# Patient Record
Sex: Female | Born: 1965
Health system: Southern US, Community
[De-identification: ages and names within clinical notes are randomized; demographics above are authoritative.]

## PROBLEM LIST (undated history)

## (undated) DIAGNOSIS — K219 Gastro-esophageal reflux disease without esophagitis: Secondary | ICD-10-CM

## (undated) HISTORY — PX: WISDOM TOOTH EXTRACTION: SHX21

## (undated) HISTORY — PX: TOOTH EXTRACTION: SUR596

## (undated) HISTORY — DX: Gastro-esophageal reflux disease without esophagitis: K21.9

## (undated) HISTORY — PX: CHOLECYSTECTOMY: SHX55

---

## 1987-10-03 HISTORY — PX: DILATION AND CURETTAGE OF UTERUS: SHX78

## 2009-05-09 ENCOUNTER — Emergency Department: Payer: Self-pay | Admitting: Emergency Medicine

## 2015-01-26 ENCOUNTER — Emergency Department
Admit: 2015-01-26 | Disposition: A | Discharge status: Home / Self Care | Payer: Self-pay | Admitting: Emergency Medicine

## 2015-01-26 LAB — COMPREHENSIVE METABOLIC PANEL
ALK PHOS: 57 U/L
ALT: 16 U/L
ANION GAP: 5 — AB (ref 7–16)
Albumin: 3.8 g/dL
BILIRUBIN TOTAL: 0.6 mg/dL
BUN: 11 mg/dL
CREATININE: 0.81 mg/dL
Calcium, Total: 8.7 mg/dL — ABNORMAL LOW
Chloride: 102 mmol/L
Co2: 28 mmol/L
EGFR (African American): >60
Glucose: 132 mg/dL — ABNORMAL HIGH
POTASSIUM: 3.8 mmol/L
SGOT(AST): 19 U/L
Sodium: 135 mmol/L
Total Protein: 7.5 g/dL

## 2015-01-26 LAB — URINALYSIS, COMPLETE
BILIRUBIN, UR: NEGATIVE
Bacteria: NONE SEEN
Glucose,UR: NEGATIVE mg/dL (ref 0–75)
Ketone: NEGATIVE
Leukocyte Esterase: NEGATIVE
NITRITE: NEGATIVE
Ph: 6 (ref 4.5–8.0)
Protein: NEGATIVE
Specific Gravity: 1.008 (ref 1.003–1.030)

## 2015-01-26 LAB — CBC WITH DIFFERENTIAL/PLATELET
BASOS PCT: 0.6 %
Basophil #: 0.1 10*3/uL (ref 0.0–0.1)
Eosinophil #: 0.3 10*3/uL (ref 0.0–0.7)
Eosinophil %: 2.3 %
HCT: 45.1 % (ref 35.0–47.0)
HGB: 15.4 g/dL (ref 12.0–16.0)
LYMPHS PCT: 12.1 %
Lymphocyte #: 1.5 10*3/uL (ref 1.0–3.6)
MCH: 33 pg (ref 26.0–34.0)
MCHC: 34.1 g/dL (ref 32.0–36.0)
MCV: 97 fL (ref 80–100)
MONOS PCT: 4.8 %
Monocyte #: 0.6 x10 3/mm (ref 0.2–0.9)
Neutrophil #: 9.7 10*3/uL — ABNORMAL HIGH (ref 1.4–6.5)
Neutrophil %: 80.2 %
Platelet: 192 10*3/uL (ref 150–440)
RBC: 4.65 10*6/uL (ref 3.80–5.20)
RDW: 12.7 % (ref 11.5–14.5)
WBC: 12.1 10*3/uL — ABNORMAL HIGH (ref 3.6–11.0)

## 2015-01-26 LAB — TROPONIN I: Troponin-I: <0.03 ng/mL

## 2015-01-26 LAB — TSH: THYROID STIMULATING HORM: 2.697 u[IU]/mL

## 2015-06-06 IMAGING — CT CT ABD-PELV W/O CM
2 of 4 series · 16 of 46 positions shown, 18 images · non-contrast
Comparison: 01/26/2015 plain film exam.

CLINICAL DATA: 48-year-old female with vagal stimulation while
using restroom. Abdominal/lower pelvic pain. Initial encounter.

EXAM:
CT ABDOMEN AND PELVIS WITHOUT CONTRAST
TECHNIQUE: Multidetector CT imaging of the abdomen and pelvis was performed
following the standard protocol without IV contrast.

[Series 2: stone standard full · axial · 0.93mm/px · z∈[-784,-308]mm · 13 of 105 slices shown, 15 images]
[im 5/105  soft-tissue]
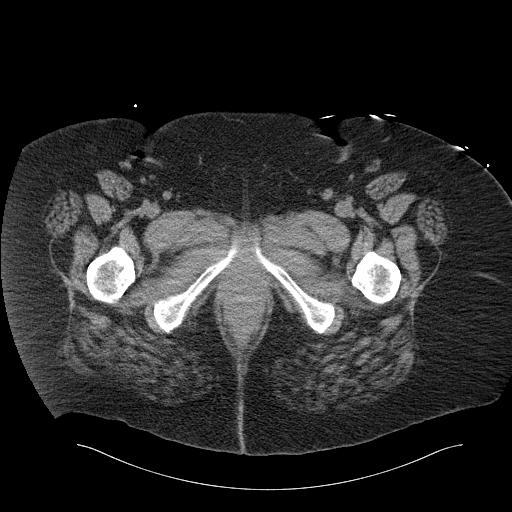
[im 5/105  bone]
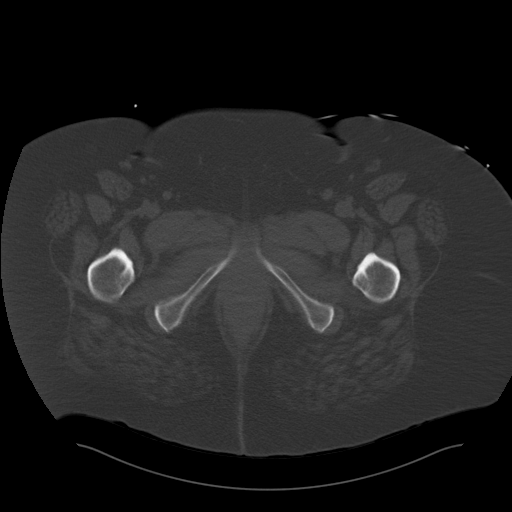
[im 15/105  soft-tissue]
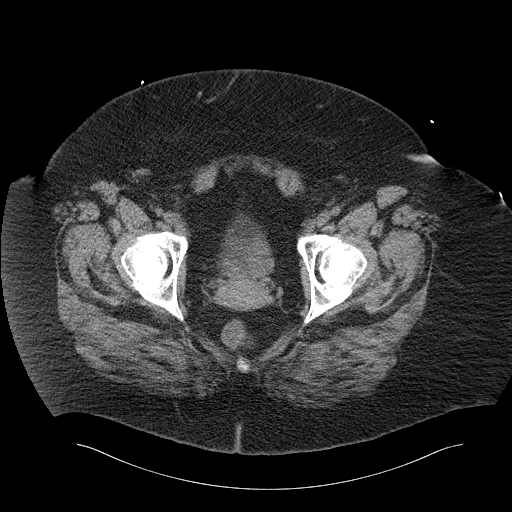
[im 24/105  soft-tissue]
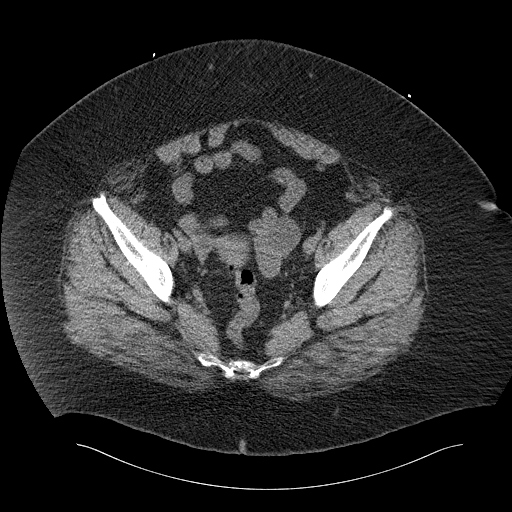
[im 29/105  soft-tissue]
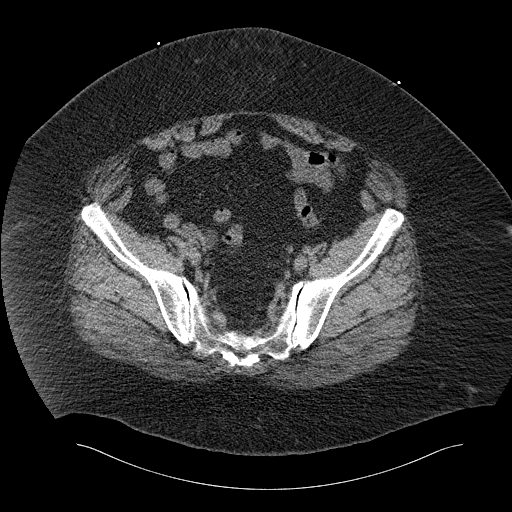
[im 38/105  soft-tissue]
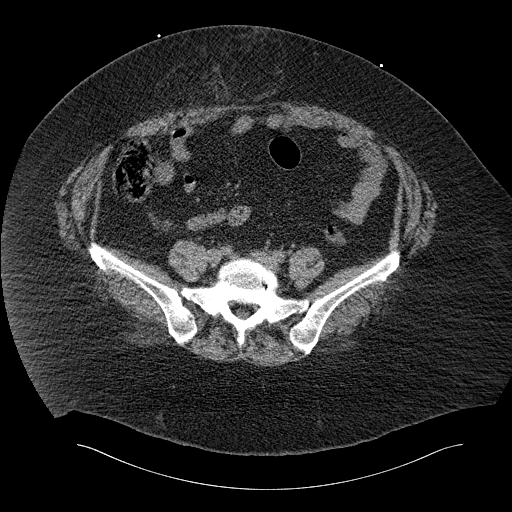
[im 43/105  soft-tissue]
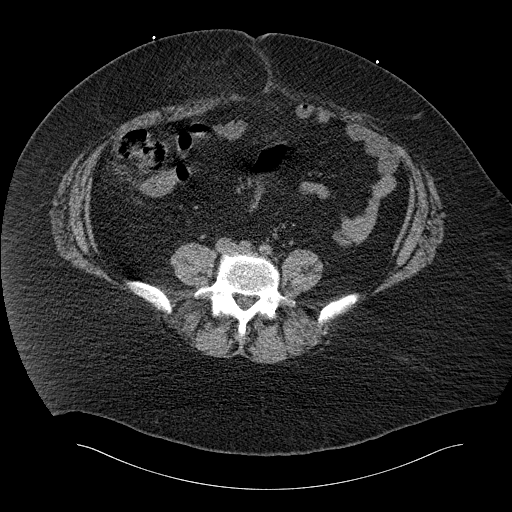
[im 53/105  soft-tissue]
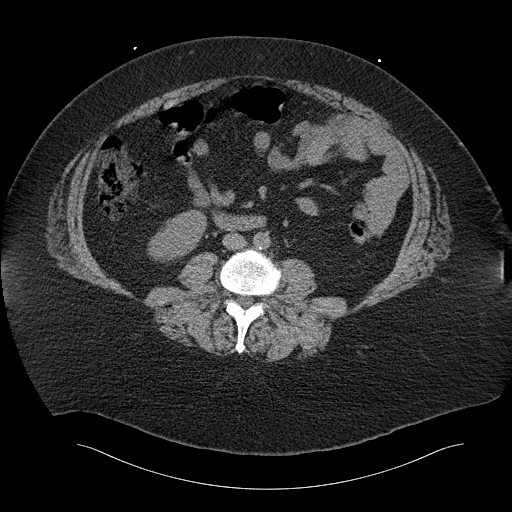
[im 62/105  soft-tissue]
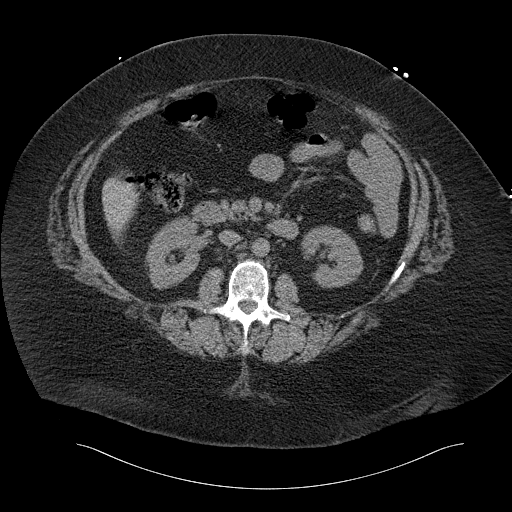
[im 67/105  soft-tissue]
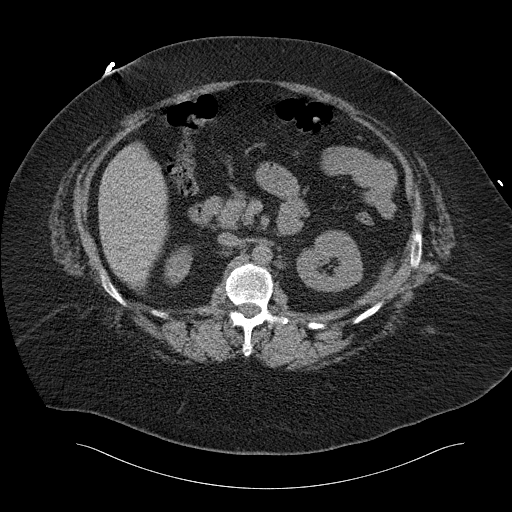
[im 67/105  bone]
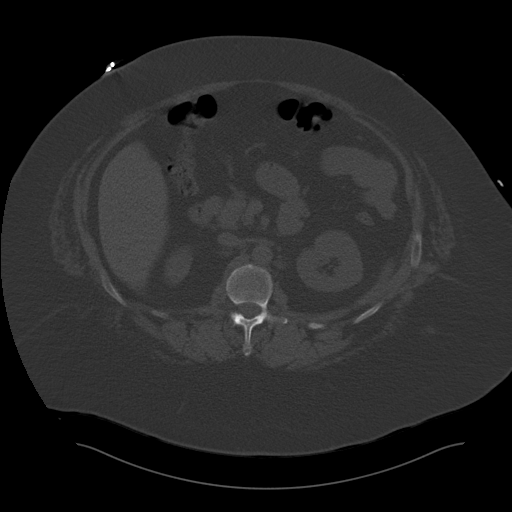
[im 76/105  soft-tissue]
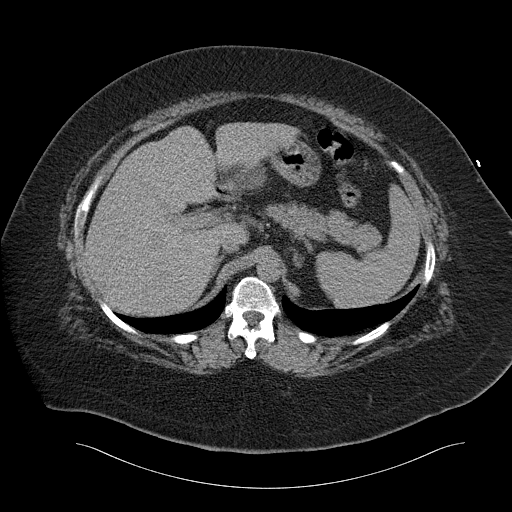
[im 81/105  soft-tissue]
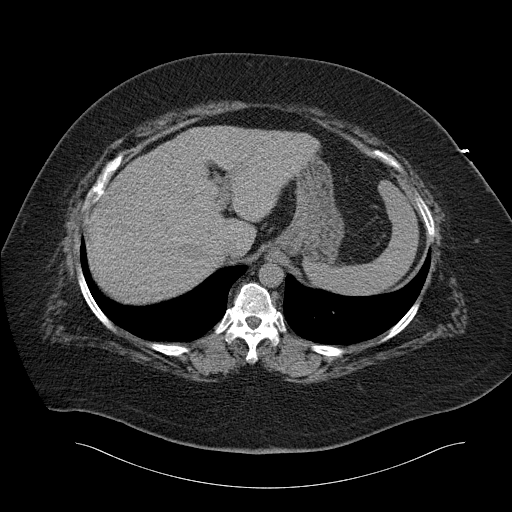
[im 90/105  soft-tissue]
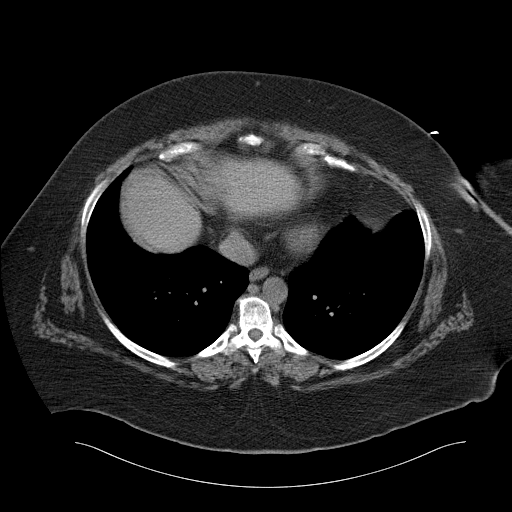
[im 100/105  soft-tissue]
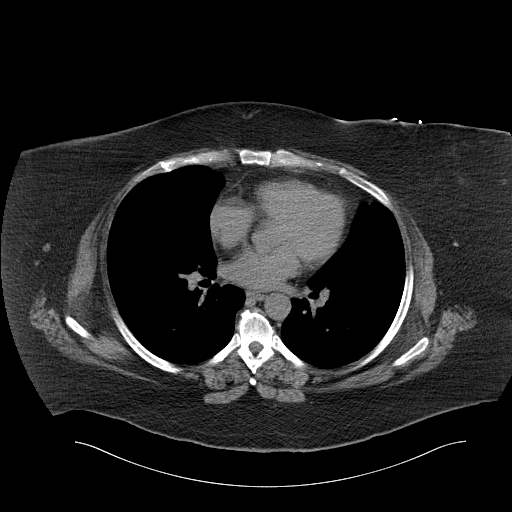

[Series 5: cor stone standard full · coronal · 0.94mm/px · 3 of 196 slices shown]
[im 66/196  soft-tissue]
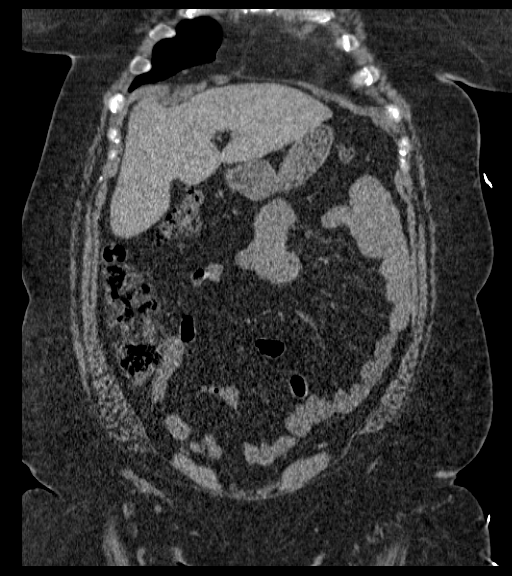
[im 87/196  soft-tissue]
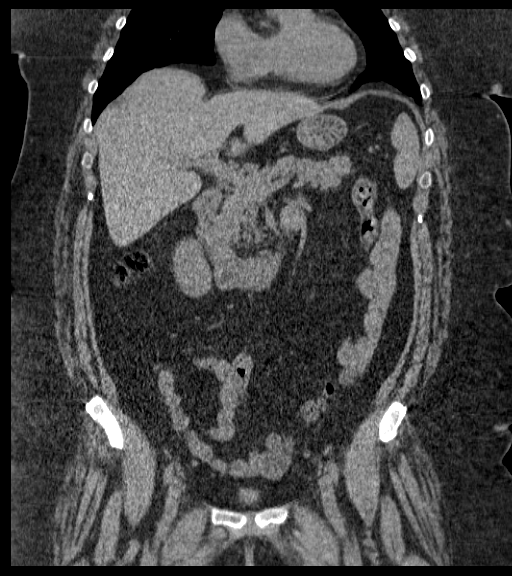
[im 109/196  soft-tissue]
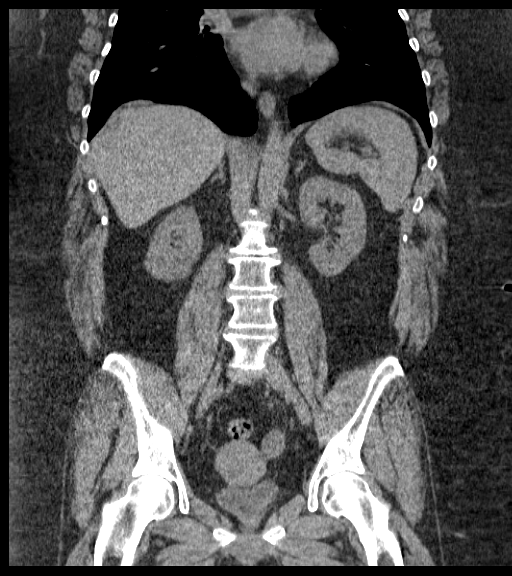

[16 of 46 positions shown; findings below may reference images not displayed]

FINDINGS: Lung bases clear.

Heart size within normal limits.

No abdominal aortic aneurysm. Trace lower abdominal aortic
calcifications.

No extra luminal bowel inflammatory process, free fluid or free air.

Large periumbilical fat and vessel containing hernia.

Mild fatty infiltration of the liver. Taking into account limitation
by non contrast imaging, no worrisome hepatic, splenic, pancreatic,
renal or adrenal lesion.

Gallbladder not visualized. Small radiopaque structure gallbladder
fossa may represent result of prior cholecystectomy.

Top-normal size external iliac/ femoral lymph nodes without
adenopathy.

Noncontrast filled underdistended views of the urinary bladder
unremarkable.

Bilateral ovarian cystic structures larger on the left spanning over
5.6 cm possibly 2 cysts side-by-side however, follow-up pelvic
sonogram may be considered.

Degenerative changes lumbar spine most notable L2-3. Sacroiliac
joint degenerative changes greater on the left.
IMPRESSION: No extra luminal bowel inflammatory process, free fluid or free air.

Large periumbilical fat and vessel containing hernia.

Mild fatty infiltration of the liver.

Gallbladder not visualized. Small radiopaque structure gallbladder
fossa may represent result of prior cholecystectomy.

Bilateral ovarian cystic structures larger on the left spanning over
5.6 cm possibly 2 cysts side-by-side however, follow-up pelvic
sonogram may be considered.

Degenerative changes lumbar spine most notable L2-3. Sacroiliac
joint degenerative changes greater on the left.

Trace calcifications lower abdominal aorta.

## 2015-11-02 ENCOUNTER — Encounter: Payer: Self-pay | Admitting: Family Medicine

## 2015-11-02 ENCOUNTER — Ambulatory Visit (INDEPENDENT_AMBULATORY_CARE_PROVIDER_SITE_OTHER): Payer: BLUE CROSS/BLUE SHIELD | Admitting: Family Medicine

## 2015-11-02 DIAGNOSIS — Z720 Tobacco use: Secondary | ICD-10-CM

## 2015-11-02 DIAGNOSIS — K219 Gastro-esophageal reflux disease without esophagitis: Secondary | ICD-10-CM

## 2015-11-02 DIAGNOSIS — R05 Cough: Secondary | ICD-10-CM

## 2015-11-02 DIAGNOSIS — R059 Cough, unspecified: Secondary | ICD-10-CM

## 2015-11-02 DIAGNOSIS — Z1239 Encounter for other screening for malignant neoplasm of breast: Secondary | ICD-10-CM | POA: Diagnosis not present

## 2015-11-02 NOTE — Progress Notes (Signed)
Pre visit review using our clinic review tool, if applicable. No additional management support is needed unless otherwise documented below in the visit note. 

## 2015-11-02 NOTE — Progress Notes (Signed)
Subjective:   Patient ID: Marissa Gutierrez, female    DOB: [birth year], 50 y.o.   MRN: [id number]  Marissa Gutierrez is a pleasant 50 y.o. year old female who presents to clinic today with [place] and Cough  on 11/02/2015  HPI:  Has never had a mammogram. Unsure when she had her last pap smear.  Has not had period since 03/2015.  Cough- ongoing for weeks.  Went to UC last week and was told it was viral. Given tessalon and cheratussiin to use prn cough.  Cough has persisted.  No longer febrile.  Denies CP or SOB. Cough is dry.  Has had some diarrhea.  Tobacco abuse- has tried to quit in past  Over 30 year pack history.  Even tried chantix without success. She is not ready to quit.  No current outpatient prescriptions on file prior to visit.   No current facility-administered medications on file prior to visit.    No Known Allergies  Past Medical History  Diagnosis Date  . GERD (gastroesophageal reflux disease)     Past Surgical History  Procedure Laterality Date  . Cholecystectomy    . Tooth extraction    . Wisdom tooth extraction    . Dilation and curettage of uterus  1989    Family History  Problem Relation Age of Onset  . Arthritis Mother   . Lung cancer Father   . Heart disease Maternal Grandfather     Social History   Social History  . Marital Status: Married    Spouse Name: N/A  . Number of Children: N/A  . Years of Education: N/A   Occupational History  . Not on file.   Social History Main Topics  . Smoking status: Current Every Day Smoker  . Smokeless tobacco: Never Used  . Alcohol Use: Yes  . Drug Use: No  . Sexual Activity: Yes    Birth Control/ Protection: Condom   Other Topics Concern  . Not on file   Social History Narrative   Works for Nutritional therapist   One son- 47 yo   The PMH, PSH, Social History, Family History, Medications, and allergies have been reviewed in [facility], and have been updated if relevant.   Review of Systems    Constitutional: Positive for fatigue. Negative for fever.  HENT: Negative.   Respiratory: Positive for cough. Negative for shortness of breath, wheezing and stridor.   Gastrointestinal: Negative.   Endocrine: Negative.   Genitourinary: Negative.   Musculoskeletal: Negative.   Skin: Negative.   Neurological: Negative.   Hematological: Negative.   All other systems reviewed and are negative.      Objective:    BP 124/76 mmHg  Pulse 71  Temp(Src) 97.8 F (36.6 C) (Oral)  Ht 5\' 6"  (1.676 m)  Wt 341 lb 12 oz (155.017 kg)  BMI 55.19 kg/m2  SpO2 95%  LMP 03/08/2015   Physical Exam  Constitutional: She is oriented to person, place, and time. She appears well-developed and well-nourished. No distress.  Morbidly obese  HENT:  Head: Normocephalic and atraumatic.  Right Ear: External ear normal.  Left Ear: External ear normal.  Nose: Nose normal.  Mouth/Throat: Oropharynx is clear and moist.  Eyes: Conjunctivae are normal.  Neck: Normal range of motion.  Cardiovascular: Normal rate and regular rhythm.   Pulmonary/Chest: Effort normal and breath sounds normal. No respiratory distress. She has no wheezes.  Musculoskeletal: Normal range of motion.  Neurological: She is alert and oriented to  person, place, and time. No cranial nerve deficit.  Skin: Skin is warm and dry.  Psychiatric: She has a normal mood and affect. Her behavior is normal. Judgment and thought content normal.  Nursing note and vitals reviewed.         Assessment & Plan:   Morbid obesity due to excess calories (HCC)  Gastroesophageal reflux disease, esophagitis presence not specified  Cough No Follow-up on file.

## 2015-11-02 NOTE — Assessment & Plan Note (Signed)
The patient was counseled on the dangers of tobacco use, and was advised to quit.  Reviewed strategies to maximize success, including removing cigarettes and smoking materials from environment. 

## 2015-11-02 NOTE — Assessment & Plan Note (Signed)
Persistent.  Agree likely viral.  Lung exam reassuring. Continue supportive care. Call or return to clinic prn if these symptoms worsen or fail to improve as anticipated. The patient indicates understanding of these issues and agrees with the plan.

## 2015-11-02 NOTE — Patient Instructions (Addendum)
Great to meet you. Please schedule an appointment for complete physical on your way out.   Please call to schedule your mammogram.

## 2015-11-22 ENCOUNTER — Encounter: Payer: BLUE CROSS/BLUE SHIELD | Admitting: Family Medicine

## 2016-01-28 DIAGNOSIS — G2581 Restless legs syndrome: Secondary | ICD-10-CM | POA: Diagnosis not present

## 2016-01-28 DIAGNOSIS — M25572 Pain in left ankle and joints of left foot: Secondary | ICD-10-CM | POA: Diagnosis not present

## 2016-01-28 DIAGNOSIS — E669 Obesity, unspecified: Secondary | ICD-10-CM | POA: Diagnosis not present

## 2016-02-04 DIAGNOSIS — E669 Obesity, unspecified: Secondary | ICD-10-CM | POA: Diagnosis not present

## 2016-02-04 DIAGNOSIS — G2581 Restless legs syndrome: Secondary | ICD-10-CM | POA: Diagnosis not present

## 2016-02-04 DIAGNOSIS — Z0001 Encounter for general adult medical examination with abnormal findings: Secondary | ICD-10-CM | POA: Diagnosis not present

## 2016-02-10 DIAGNOSIS — Z1231 Encounter for screening mammogram for malignant neoplasm of breast: Secondary | ICD-10-CM | POA: Diagnosis not present

## 2016-03-20 DIAGNOSIS — R05 Cough: Secondary | ICD-10-CM | POA: Diagnosis not present

## 2016-03-20 DIAGNOSIS — J069 Acute upper respiratory infection, unspecified: Secondary | ICD-10-CM | POA: Diagnosis not present

## 2016-03-30 DIAGNOSIS — Z0001 Encounter for general adult medical examination with abnormal findings: Secondary | ICD-10-CM | POA: Diagnosis not present

## 2016-03-30 DIAGNOSIS — Z6841 Body Mass Index (BMI) 40.0 and over, adult: Secondary | ICD-10-CM | POA: Diagnosis not present

## 2016-03-30 DIAGNOSIS — Z124 Encounter for screening for malignant neoplasm of cervix: Secondary | ICD-10-CM | POA: Diagnosis not present

## 2016-05-03 DIAGNOSIS — M5430 Sciatica, unspecified side: Secondary | ICD-10-CM | POA: Diagnosis not present

## 2016-05-03 DIAGNOSIS — M9905 Segmental and somatic dysfunction of pelvic region: Secondary | ICD-10-CM | POA: Diagnosis not present

## 2016-05-03 DIAGNOSIS — M9903 Segmental and somatic dysfunction of lumbar region: Secondary | ICD-10-CM | POA: Diagnosis not present

## 2016-05-03 DIAGNOSIS — M791 Myalgia: Secondary | ICD-10-CM | POA: Diagnosis not present

## 2016-05-05 DIAGNOSIS — M5430 Sciatica, unspecified side: Secondary | ICD-10-CM | POA: Diagnosis not present

## 2016-05-05 DIAGNOSIS — M9905 Segmental and somatic dysfunction of pelvic region: Secondary | ICD-10-CM | POA: Diagnosis not present

## 2016-05-05 DIAGNOSIS — M791 Myalgia: Secondary | ICD-10-CM | POA: Diagnosis not present

## 2016-05-05 DIAGNOSIS — M9903 Segmental and somatic dysfunction of lumbar region: Secondary | ICD-10-CM | POA: Diagnosis not present

## 2016-05-08 DIAGNOSIS — M9903 Segmental and somatic dysfunction of lumbar region: Secondary | ICD-10-CM | POA: Diagnosis not present

## 2016-05-08 DIAGNOSIS — M5430 Sciatica, unspecified side: Secondary | ICD-10-CM | POA: Diagnosis not present

## 2016-05-08 DIAGNOSIS — M791 Myalgia: Secondary | ICD-10-CM | POA: Diagnosis not present

## 2016-05-08 DIAGNOSIS — M9905 Segmental and somatic dysfunction of pelvic region: Secondary | ICD-10-CM | POA: Diagnosis not present

## 2016-05-19 DIAGNOSIS — M9903 Segmental and somatic dysfunction of lumbar region: Secondary | ICD-10-CM | POA: Diagnosis not present

## 2016-05-19 DIAGNOSIS — M791 Myalgia: Secondary | ICD-10-CM | POA: Diagnosis not present

## 2016-05-19 DIAGNOSIS — M9905 Segmental and somatic dysfunction of pelvic region: Secondary | ICD-10-CM | POA: Diagnosis not present

## 2016-05-19 DIAGNOSIS — M5430 Sciatica, unspecified side: Secondary | ICD-10-CM | POA: Diagnosis not present

## 2016-07-13 DIAGNOSIS — J0111 Acute recurrent frontal sinusitis: Secondary | ICD-10-CM | POA: Diagnosis not present

## 2017-06-06 DIAGNOSIS — E669 Obesity, unspecified: Secondary | ICD-10-CM | POA: Diagnosis not present

## 2017-06-06 DIAGNOSIS — M25511 Pain in right shoulder: Secondary | ICD-10-CM | POA: Diagnosis not present

## 2017-08-17 DIAGNOSIS — J029 Acute pharyngitis, unspecified: Secondary | ICD-10-CM | POA: Diagnosis not present

## 2017-08-17 DIAGNOSIS — F17210 Nicotine dependence, cigarettes, uncomplicated: Secondary | ICD-10-CM | POA: Diagnosis not present

## 2017-08-17 DIAGNOSIS — B373 Candidiasis of vulva and vagina: Secondary | ICD-10-CM | POA: Diagnosis not present

## 2017-08-25 DIAGNOSIS — R062 Wheezing: Secondary | ICD-10-CM | POA: Diagnosis not present

## 2017-08-25 DIAGNOSIS — J209 Acute bronchitis, unspecified: Secondary | ICD-10-CM | POA: Diagnosis not present

## 2017-08-28 DIAGNOSIS — R05 Cough: Secondary | ICD-10-CM | POA: Diagnosis not present

## 2017-08-28 DIAGNOSIS — J069 Acute upper respiratory infection, unspecified: Secondary | ICD-10-CM | POA: Diagnosis not present

## 2017-10-18 ENCOUNTER — Encounter: Payer: Self-pay | Admitting: Internal Medicine

## 2017-10-18 ENCOUNTER — Ambulatory Visit: Payer: BLUE CROSS/BLUE SHIELD | Admitting: Internal Medicine

## 2017-10-18 VITALS — BP 138/63 | HR 68 | Resp 16 | Ht 67.0 in | Wt 319.4 lb

## 2017-10-18 DIAGNOSIS — R079 Chest pain, unspecified: Secondary | ICD-10-CM | POA: Diagnosis not present

## 2017-10-18 DIAGNOSIS — K219 Gastro-esophageal reflux disease without esophagitis: Secondary | ICD-10-CM | POA: Diagnosis not present

## 2017-10-18 DIAGNOSIS — F17219 Nicotine dependence, cigarettes, with unspecified nicotine-induced disorders: Secondary | ICD-10-CM | POA: Diagnosis not present

## 2017-10-18 DIAGNOSIS — R072 Precordial pain: Secondary | ICD-10-CM

## 2017-10-18 NOTE — Progress Notes (Signed)
[facility] [place],  [id number]  Pulmonary Sleep Medicine  Office Visit Note  Patient Name: Marissa Gutierrez DOB: 1965-10-03 MRN [id number]  Date of Service: 10/18/2017  Complaints/HPI:  Patient presents as a walk-in for chest pain.  She states she has had pain in her central chest tube feels a very deep down pain.  She says sometimes as a heaviness.  She states last night she has noted pain now is going in her arm also.  Did not appear to be brought on by anything.  She states she has been moving things around her home and she thought that maybe she actually strained herself in pulled muscle.  She states she has some shortness of breath associated.  No nausea no vomiting.  She has had no syncope.  She denies having any palpitations.  She states she does smoke.  In she is overweight.  Denies having any diabetes.  She is postmenopausal.  She has never had a cardiac evaluation done in the past.  States that her family history is strong 4 heart problems her father had an irregular heartbeat  ROS  General: (-) fever, (-) chills, (-) night sweats, (-) weakness Skin: (-) rashes, (-) itching,. Eyes: (-) visual changes, (-) redness, (-) itching. Nose and Sinuses: (-) nasal stuffiness or itchiness, (-) postnasal drip, (-) nosebleeds, (-) sinus trouble. Mouth and Throat: (-) sore throat, (-) hoarseness. Neck: (-) swollen glands, (-) enlarged thyroid, (-) neck pain. Respiratory:  no cough, (-) bloody sputum,  no shortness of breath,  no wheezing. Cardiovascular: + ankle swelling, + chest pain. Lymphatic: (-) lymph node enlargement. Neurologic: (-) numbness, (-) tingling. Psychiatric: (-) anxiety, (-) depression   Current Medication: Outpatient Encounter Medications as of 10/18/2017  Medication Sig  . benzonatate (TESSALON) 200 MG capsule Take 200 mg by mouth 3 (three) times daily as needed for cough.  Marland Kitchen guaiFENesin-codeine (ROBITUSSIN AC) 100-10 MG/5ML syrup Take 5  mLs by mouth 3 (three) times daily as needed for cough.   No facility-administered encounter medications on file as of 10/18/2017.     Surgical History: Past Surgical History:  Procedure Laterality Date  . CHOLECYSTECTOMY    . DILATION AND CURETTAGE OF UTERUS  1989  . TOOTH EXTRACTION    . WISDOM TOOTH EXTRACTION      Medical History: Past Medical History:  Diagnosis Date  . GERD (gastroesophageal reflux disease)     Family History: Family History  Problem Relation Age of Onset  . Arthritis Mother   . Lung cancer Father   . Heart disease Maternal Grandfather     Social History: Social History   Socioeconomic History  . Marital status: Married    Spouse name: Not on file  . Number of children: Not on file  . Years of education: Not on file  . Highest education level: Not on file  Social Needs  . Financial resource strain: Not on file  . Food insecurity - worry: Not on file  . Food insecurity - inability: Not on file  . Transportation needs - medical: Not on file  . Transportation needs - non-medical: Not on file  Occupational History  . Not on file  Tobacco Use  . Smoking status: Current Every Day Smoker  . Smokeless tobacco: Never Used  Substance and Sexual Activity  . Alcohol use: Yes  . Drug use: No  . Sexual activity: Yes    Birth control/protection: Condom  Other Topics Concern  . 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 rate 16, height 5\' 7"  (1.702 m), weight (!) 319 lb 6.4 oz (144.9 kg), SpO2 95 %.  Examination: General Appearance: The patient is well-developed, well-nourished, and in no distress. Skin: Gross inspection of skin unremarkable. Head: normocephalic, no gross deformities. Eyes: no gross deformities noted. ENT: ears appear grossly normal no exudates. malampatti score 3 Neck: Supple. No thyromegaly. No LAD. Respiratory:  Good air  entry noted bilaterally no rhonchi or rales. Cardiovascular: Normal S1 and S2 without murmur or rub. Extremities: No cyanosis. pulses are equal. Neurologic: Alert and oriented. No involuntary movements.  LABS: No results found for this or any previous visit (from the past 2160 hour(s)).  Radiology: Ct Abdomen Pelvis Wo Contrast  Result Date: 01/26/2015 CLINICAL DATA:  52 year old female with vagal stimulation while using restroom. Abdominal/lower pelvic pain. Initial encounter. EXAM: CT ABDOMEN AND PELVIS WITHOUT CONTRAST TECHNIQUE: Multidetector CT imaging of the abdomen and pelvis was performed following the standard protocol without IV contrast. COMPARISON:  01/26/2015 plain film exam. FINDINGS: Lung bases clear. Heart size within normal limits. No abdominal aortic aneurysm. Trace lower abdominal aortic calcifications. No extra luminal bowel inflammatory process, free fluid or free air. Large periumbilical fat and vessel containing hernia. Mild fatty infiltration of the liver. Taking into account limitation by non contrast imaging, no worrisome hepatic, splenic, pancreatic, renal or adrenal lesion. Gallbladder not visualized. Small radiopaque structure gallbladder fossa may represent result of prior cholecystectomy. Top-normal size external iliac/ femoral lymph nodes without adenopathy. Noncontrast filled underdistended views of the urinary bladder unremarkable. Bilateral ovarian cystic structures larger on the left spanning over 5.6 cm possibly 2 cysts side-by-side however, follow-up pelvic sonogram may be considered. Degenerative changes lumbar spine most notable L2-3. Sacroiliac joint degenerative changes greater on the left. IMPRESSION: No extra luminal bowel inflammatory process, free fluid or free air. Large periumbilical fat and vessel containing hernia. Mild fatty infiltration of the liver. Gallbladder not visualized. Small radiopaque structure gallbladder fossa may represent result of prior  cholecystectomy. Bilateral ovarian cystic structures larger on the left spanning over 5.6 cm possibly 2 cysts side-by-side however, follow-up pelvic sonogram may be considered. Degenerative changes lumbar spine most notable L2-3. Sacroiliac joint degenerative changes greater on the left. Trace calcifications lower abdominal aorta. Electronically Signed   By: Genia Del M.D.   On: 01/26/2015 07:53   Dg Abd 1 View  Result Date: 01/26/2015 CLINICAL DATA:  Lower abdominal pain and pressure. EXAM: ABDOMEN - 1 VIEW COMPARISON:  None. FINDINGS: The bowel gas pattern is normal. Stool volume is within normal limits. No radio-opaque calculi or other significant radiographic abnormality are seen. IMPRESSION: Negative. Electronically Signed   By: Monte Fantasia M.D.   On: 01/26/2015 07:10   US Transvaginal Non-ob  Result Date: 01/26/2015 CLINICAL DATA:  Large cysts, pelvic pain beginning last night EXAM: TRANSABDOMINAL AND TRANSVAGINAL ULTRASOUND OF PELVIS DOPPLER ULTRASOUND OF OVARIES TECHNIQUE: Both transabdominal and transvaginal ultrasound examinations of the pelvis were performed. Transabdominal technique was performed for global imaging of the pelvis including uterus, ovaries, adnexal regions, and pelvic cul-de-sac. It was necessary to proceed with endovaginal exam following the transabdominal exam to visualize the ovaries. Color and duplex Doppler ultrasound was utilized to evaluate blood flow to the ovaries. COMPARISON:  CT 01/26/2015 FINDINGS: Uterus Measurements: 7.9 x 3.8 x 5.9 cm. No fibroids or other mass visualized.  Endometrium Thickness: 10.1 mm in thickness.  No focal abnormality visualized. Right ovary Measurements: 3.5 x 2.0 x 2.8 cm. Small simple appearing cystic area in the right ovary measures up to 3.5 cm. Left ovary Measurements: 2.4 x 2.2 x 4.0 cm. Elongated cystic area in the right adnexa measures 5.8 x 3.6 x 2.6 cm. The minimally complex cyst measures up to 2.4 cm, likely small hemorrhagic  cyst or follicle. Pulsed Doppler evaluation of both ovaries demonstrates normal low-resistance arterial and venous waveforms. Other findings With IMPRESSION: Small right ovarian cyst which appears simple. Minimally complex left ovarian follicle, likely hemorrhagic follicle. Elongated paraovarian cystic structure could reflect paraovarian cyst or hydrosalpinx. No evidence of torsion. Electronically Signed   By: Rolm Baptise M.D.   On: 01/26/2015 09:15   [place] Art/ven Flow Abd Pelv Doppler Limited  Result Date: 01/26/2015 CLINICAL DATA:  Large cysts, pelvic pain beginning last night EXAM: TRANSABDOMINAL AND TRANSVAGINAL ULTRASOUND OF PELVIS DOPPLER ULTRASOUND OF OVARIES TECHNIQUE: Both transabdominal and transvaginal ultrasound examinations of the pelvis were performed. Transabdominal technique was performed for global imaging of the pelvis including uterus, ovaries, adnexal regions, and pelvic cul-de-sac. It was necessary to proceed with endovaginal exam following the transabdominal exam to visualize the ovaries. Color and duplex Doppler ultrasound was utilized to evaluate blood flow to the ovaries. COMPARISON:  CT 01/26/2015 FINDINGS: Uterus Measurements: 7.9 x 3.8 x 5.9 cm. No fibroids or other mass visualized. Endometrium Thickness: 10.1 mm in thickness.  No focal abnormality visualized. Right ovary Measurements: 3.5 x 2.0 x 2.8 cm. Small simple appearing cystic area in the right ovary measures up to 3.5 cm. Left ovary Measurements: 2.4 x 2.2 x 4.0 cm. Elongated cystic area in the right adnexa measures 5.8 x 3.6 x 2.6 cm. The minimally complex cyst measures up to 2.4 cm, likely small hemorrhagic cyst or follicle. Pulsed Doppler evaluation of both ovaries demonstrates normal low-resistance arterial and venous waveforms. Other findings With IMPRESSION: Small right ovarian cyst which appears simple. Minimally complex left ovarian follicle, likely hemorrhagic follicle. Elongated paraovarian cystic structure could  reflect paraovarian cyst or hydrosalpinx. No evidence of torsion. Electronically Signed   By: Rolm Baptise M.D.   On: 01/26/2015 09:15   Dg Chest Port 1 View  Result Date: 01/26/2015 CLINICAL DATA:  Near syncope EXAM: PORTABLE CHEST - 1 VIEW COMPARISON:  05/09/2009 FINDINGS: Normal heart size and mediastinal contours when accounting for a probable lower mediastinal fat pad. No acute infiltrate or edema. No effusion or pneumothorax. No acute osseous findings. IMPRESSION: No active disease. Electronically Signed   By: Monte Fantasia M.D.   On: 01/26/2015 07:10    No results found.  No results found.    Assessment and Plan: Patient Active Problem List   Diagnosis Date Noted  . Morbid obesity ([place]) 11/02/2015  . GERD (gastroesophageal reflux disease) 11/02/2015  . Cough 11/02/2015  . Tobacco abuse 11/02/2015    1. Chest Pain  unclear etiology the seems a little bit suspicious for acute coronary syndrome At this time she is pain free she appears to be comfortable.   We will schedule her for a stress test and also get an echocardiogram done.   EKG was done in the room and this did not reveal any acute changes though of poor quality due to her body habitus  2. Obesity  we briefly discussed obesity and coronary risk She needs to work on weight loss  3. GERD  she states that she does have history of  pretty bad reflux in the past  She had been on Prilosec S suggested that she start taking over-the-counter medications again  4. Smoker  we discussed smoking cessation at this time she is going to try to quit smoking She is going to explore E cigarette as well as nicotine replacement therapy   General Counseling: I have discussed the findings of the evaluation and examination with [place].  I have also discussed any further diagnostic evaluation thatmay be needed or ordered today. Caleigha verbalizes understanding of the findings of todays visit. We also reviewed her medications today and  discussed drug interactions and side effects including but not limited excessive drowsiness and altered mental states. We also discussed that there is always a risk not just to her but also people around her. she has been encouraged to call the office with any questions or concerns that should arise related to todays visit.    Time spent: 69min  I have personally obtained a history, examined the patient, evaluated laboratory and imaging results, formulated the assessment and plan and placed orders.    Allyne Gee, MD [facility] Pulmonary and Critical Care Sleep medicine

## 2017-10-18 NOTE — Patient Instructions (Signed)
Chest Wall Pain °Chest wall pain is pain in or around the bones and muscles of your chest. Sometimes, an injury causes this pain. Sometimes, the cause may not be known. This pain may take several weeks or longer to get better. °Follow these instructions at home: °Pay attention to any changes in your symptoms. Take these actions to help with your pain: °· Rest as told by your doctor. °· Avoid activities that cause pain. Try not to use your chest, belly (abdominal), or side muscles to lift heavy things. °· If directed, apply ice to the painful area: °? Put ice in a plastic bag. °? Place a towel between your skin and the bag. °? Leave the ice on for 20 minutes, 2-3 times per day. °· Take over-the-counter and prescription medicines only as told by your doctor. °· Do not use tobacco products, including cigarettes, chewing tobacco, and e-cigarettes. If you need help quitting, ask your doctor. °· Keep all follow-up visits as told by your doctor. This is important. ° °Contact a doctor if: °· You have a fever. °· Your chest pain gets worse. °· You have new symptoms. °Get help right away if: °· You feel sick to your stomach (nauseous) or you throw up (vomit). °· You feel sweaty or light-headed. °· You have a cough with phlegm (sputum) or you cough up blood. °· You are short of breath. °This information is not intended to replace advice given to you by your health care provider. Make sure you discuss any questions you have with your health care provider. °Document Released: 03/06/2008 Document Revised: 02/24/2016 Document Reviewed: 12/14/2014 °Elsevier Interactive Patient Education © 2018 [employer] ° °

## 2017-10-19 DIAGNOSIS — M25511 Pain in right shoulder: Secondary | ICD-10-CM | POA: Diagnosis not present

## 2017-10-19 DIAGNOSIS — R079 Chest pain, unspecified: Secondary | ICD-10-CM | POA: Diagnosis not present

## 2018-01-01 ENCOUNTER — Encounter: Payer: Self-pay | Admitting: Internal Medicine

## 2018-01-01 ENCOUNTER — Ambulatory Visit: Payer: BLUE CROSS/BLUE SHIELD | Admitting: Internal Medicine

## 2018-01-01 VITALS — BP 110/68 | HR 62 | Temp 98.0°F | Resp 16 | Ht 67.0 in | Wt 314.0 lb

## 2018-01-01 DIAGNOSIS — J0100 Acute maxillary sinusitis, unspecified: Secondary | ICD-10-CM | POA: Diagnosis not present

## 2018-01-01 DIAGNOSIS — J3089 Other allergic rhinitis: Secondary | ICD-10-CM | POA: Diagnosis not present

## 2018-01-01 MED ORDER — AMOXICILLIN-POT CLAVULANATE 875-125 MG PO TABS: 1.0000 | ORAL_TABLET | Freq: Two times a day (BID) | ORAL | 0 refills | Status: DC

## 2018-01-01 NOTE — Progress Notes (Signed)
[facility] [place], [place] [address]  Internal MEDICINE  Office Visit Note  Patient Name: Marissa Gutierrez  [id number]  [id number]  Date of Service: 01/25/2018  Chief Complaint  Patient presents with  . Sinusitis    started saturday with sore throat  . Sore Throat  . Cough    Sinusitis  This is a new problem. The current episode started in the past 7 days. There has been no fever. Associated symptoms include congestion, coughing and headaches. Pertinent negatives include no chills.  Sore Throat   Associated symptoms include congestion, coughing and headaches.  Cough  Associated symptoms include a fever, headaches and postnasal drip. Pertinent negatives include no chills.   Current Medication: Outpatient Encounter Medications as of 01/01/2018  Medication Sig  . levocetirizine (XYZAL) 5 MG tablet Take 5 mg by mouth every evening.  Marland Kitchen amoxicillin-clavulanate (AUGMENTIN) 875-125 MG tablet Take 1 tablet by mouth 2 (two) times daily.  . [DISCONTINUED] benzonatate (TESSALON) 200 MG capsule Take 200 mg by mouth 3 (three) times daily as needed for cough.  . [DISCONTINUED] guaiFENesin-codeine (ROBITUSSIN AC) 100-10 MG/5ML syrup Take 5 mLs by mouth 3 (three) times daily as needed for cough.   No facility-administered encounter medications on file as of 01/01/2018.     Surgical History: Past Surgical History:  Procedure Laterality Date  . CHOLECYSTECTOMY    . DILATION AND CURETTAGE OF UTERUS  1989  . TOOTH EXTRACTION    . WISDOM TOOTH EXTRACTION      Medical History: Past Medical History:  Diagnosis Date  . GERD (gastroesophageal reflux disease)     Family History: Family History  Problem Relation Age of Onset  . Arthritis Mother   . Lung cancer Father   . Heart disease Maternal Grandfather     Social History   Socioeconomic History  . Marital status: Married    Spouse name: Not on file  . Number of children: Not on file  . Years of education:  Not on file  . Highest education level: Not on file  Occupational History  . Not on file  Social Needs  . Financial resource strain: Not on file  . Food insecurity:    Worry: Not on file    Inability: Not on file  . Transportation needs:    Medical: Not on file    Non-medical: Not on file  Tobacco Use  . Smoking status: Current Every Day Smoker  . Smokeless tobacco: Never Used  Substance and Sexual Activity  . Alcohol use: Yes  . Drug use: No  . Sexual activity: Yes    Birth control/protection: Condom  Lifestyle  . Physical activity:    Days per week: Not on file    Minutes per session: Not on file  . Stress: Not on file  Relationships  . Social connections:    Talks on phone: Not on file    Gets together: Not on file    Attends religious service: Not on file    Active member of club or organization: Not on file    Attends meetings of clubs or organizations: Not on file    Relationship status: Not on file  . Intimate partner violence:    Fear of current or ex partner: Not on file    Emotionally abused: Not on file    Physically abused: Not on file    Forced sexual activity: Not on file  Other Topics Concern  . Not on file  Social  History Narrative   Works for Nutritional therapist   One son- 7 yo   Review of Systems  Constitutional: Positive for fever. Negative for chills.  HENT: Positive for congestion and postnasal drip.   Eyes: Negative.   Respiratory: Positive for cough.   Cardiovascular: Negative.   Neurological: Positive for headaches.   Vital Signs: BP 110/68 (BP Location: Right Arm, Patient Position: Sitting, Cuff Size: Normal)   Pulse 62   Temp 98 F (36.7 C) (Oral)   Resp 16   Ht 5\' 7"  (1.702 m)   Wt (!) 314 lb (142.4 kg)   SpO2 97%   BMI 49.18 kg/m   Physical Exam  Constitutional: She appears well-developed and well-nourished.  HENT:  Head: Normocephalic and atraumatic.  Nose: Mucosal edema and sinus tenderness present. Right sinus  exhibits maxillary sinus tenderness. Left sinus exhibits maxillary sinus tenderness.  Mouth/Throat: Oropharynx is clear and moist.  Eyes: Pupils are equal, round, and reactive to light.  Neck: Normal range of motion. Neck supple.   Assessment/Plan: 1. Non-seasonal allergic rhinitis due to other allergic trigger 2. Acute non-recurrent maxillary sinusitis  Other orders - May try otc Flonase  - levocetirizine (XYZAL) 5 MG tablet; Take 5 mg by mouth every evening. - amoxicillin-clavulanate (AUGMENTIN) 875-125 MG tablet; Take 1 tablet by mouth 2 (two) times daily.  General Counseling: [name] verbalizes understanding of the findings of todays visit and agrees with plan of treatment. I have discussed any further diagnostic evaluation that may be needed or ordered today. We also reviewed her medications today. she has been encouraged to call the office with any questions or concerns that should arise related to todays visit.   Meds ordered this encounter  Medications  . amoxicillin-clavulanate (AUGMENTIN) 875-125 MG tablet    Sig: Take 1 tablet by mouth 2 (two) times daily.    Dispense:  20 tablet    Refill:  0    Time spent:15 Minutes    Dr Lavera Guise Internal medicine

## 2018-04-03 ENCOUNTER — Telehealth: Payer: Self-pay

## 2018-04-03 NOTE — Telephone Encounter (Signed)
Unable to leave message to advise her that we will waive her $20.00 balance. Titania  *voicemail full*

## 2018-05-09 ENCOUNTER — Encounter: Payer: Self-pay | Admitting: Adult Health

## 2018-05-09 ENCOUNTER — Ambulatory Visit: Payer: Self-pay | Admitting: Adult Health

## 2018-05-09 VITALS — BP 138/82 | HR 69 | Temp 98.0°F | Ht 67.0 in | Wt 316.0 lb

## 2018-05-09 DIAGNOSIS — F32A Depression, unspecified: Secondary | ICD-10-CM

## 2018-05-09 DIAGNOSIS — K219 Gastro-esophageal reflux disease without esophagitis: Secondary | ICD-10-CM | POA: Diagnosis not present

## 2018-05-09 DIAGNOSIS — F329 Major depressive disorder, single episode, unspecified: Secondary | ICD-10-CM

## 2018-05-09 DIAGNOSIS — F17219 Nicotine dependence, cigarettes, with unspecified nicotine-induced disorders: Secondary | ICD-10-CM | POA: Diagnosis not present

## 2018-05-09 MED ORDER — SERTRALINE HCL 50 MG PO TABS: 50.0000 mg | ORAL_TABLET | Freq: Every day | ORAL | 3 refills | Status: DC

## 2018-05-09 NOTE — Progress Notes (Signed)
[facility] [place],  [address]  Internal MEDICINE  Office Visit Note  Patient Name: Marissa Gutierrez  [id number]  [id number]  Date of Service: 05/09/2018  Chief Complaint  Patient presents with  . Depression    4 months, difficulty with dealing and talking to people ,having panic attacks  . Quality Metric Gaps    mammogram, colonoscopy      HPI Pt is here for a sick visit. Pt states she has been depressed and anxious for the last 3-4 months.  In this time her Brother and has been diagnosed and died of cancer.  She was his primary care giver.  Also her mother is suffering from Parkinson's disease.  She also her primary caregiver as well.  Her work has been stressful, and she is having to miss a lot of work to help her mother with doctors appointments etc.  Today, she is tearful in the exam room, and saying "I just can't get control of the crying and feeling this way."  Pt denies SI or HI.    Current Medication:  Outpatient Encounter Medications as of 05/09/2018  Medication Sig  . [DISCONTINUED] amoxicillin-clavulanate (AUGMENTIN) 875-125 MG tablet Take 1 tablet by mouth 2 (two) times daily. (Patient not taking: Reported on 05/09/2018)  . [DISCONTINUED] levocetirizine (XYZAL) 5 MG tablet Take 5 mg by mouth every evening.   No facility-administered encounter medications on file as of 05/09/2018.       Medical History: Past Medical History:  Diagnosis Date  . GERD (gastroesophageal reflux disease)      Vital Signs: BP 138/82   Pulse 69   Temp 98 F (36.7 C)   Ht 5\' 7"  (1.702 m)   Wt (!) 316 lb (143.3 kg)   SpO2 98%   BMI 49.49 kg/m    Review of Systems  Constitutional: Negative for chills, fatigue and unexpected weight change.  HENT: Negative for congestion, rhinorrhea, sneezing and sore throat.   Eyes: Negative for photophobia, pain and redness.  Respiratory: Negative for cough, chest tightness and shortness of breath.   Cardiovascular:  Negative for chest pain and palpitations.  Gastrointestinal: Negative for abdominal pain, constipation, diarrhea, nausea and vomiting.  Endocrine: Negative.   Genitourinary: Negative for dysuria and frequency.  Musculoskeletal: Negative for arthralgias, back pain, joint swelling and neck pain.  Skin: Negative for rash.  Allergic/Immunologic: Negative.   Neurological: Negative for tremors and numbness.  Hematological: Negative for adenopathy. Does not bruise/bleed easily.  Psychiatric/Behavioral: Negative for behavioral problems and sleep disturbance. The patient is not nervous/anxious.     Physical Exam  Constitutional: She is oriented to person, place, and time. She appears well-developed and well-nourished. No distress.  HENT:  Head: Normocephalic and atraumatic.  Mouth/Throat: Oropharynx is clear and moist. No oropharyngeal exudate.  Eyes: Pupils are equal, round, and reactive to light. EOM are normal.  Neck: Normal range of motion. Neck supple. No JVD present. No tracheal deviation present. No thyromegaly present.  Cardiovascular: Normal rate, regular rhythm and normal heart sounds. Exam reveals no gallop and no friction rub.  No murmur heard. Pulmonary/Chest: Effort normal and breath sounds normal. No respiratory distress. She has no wheezes. She has no rales. She exhibits no tenderness.  Abdominal: Soft. There is no tenderness. There is no guarding.  Musculoskeletal: Normal range of motion.  Lymphadenopathy:    She has no cervical adenopathy.  Neurological: She is alert and oriented to person, place, and time. No cranial nerve deficit.  Skin: Skin is warm and dry. She is not diaphoretic.  Psychiatric: She has a normal mood and affect. Her behavior is normal. Judgment and thought content normal.  Nursing note and vitals reviewed.   Assessment/Plan: 1. Depression, unspecified depression type Pt to start Zoloft.  Will follow up in 6 weeks.  Encouraged pt to seek counseling,  offered referral.    - sertraline (ZOLOFT) 50 MG tablet; Take 1 tablet (50 mg total) by mouth daily.  Dispense: 30 tablet; Refill: 3  2. Cigarette nicotine dependence with nicotine-induced disorder Smoking cessation counseling: 1. Pt acknowledges the risks of long term smoking, she will try to quite smoking. 2. Options for different medications including nicotine products, chewing gum, patch etc, Wellbutrin and Chantix is discussed 3. Goal and date of compete cessation is discussed 4. Total time spent in smoking cessation is 10 min.   3. Gastroesophageal reflux disease, esophagitis presence not specified Stable.  Pt uses OTC Rolaids as needed.   General Counseling: Marissa Gutierrez verbalizes understanding of the findings of todays visit and agrees with plan of treatment. I have discussed any further diagnostic evaluation that may be needed or ordered today. We also reviewed her medications today. she has been encouraged to call the office with any questions or concerns that should arise related to todays visit.   No orders of the defined types were placed in this encounter.   No orders of the defined types were placed in this encounter.   Time spent: 25 Minutes  This patient was seen by Orson Gear AGNP-C in Collaboration with Dr Lavera Guise as a part of collaborative care agreement

## 2018-05-09 NOTE — Patient Instructions (Signed)

## 2018-06-24 ENCOUNTER — Encounter: Payer: Self-pay | Admitting: Adult Health

## 2018-12-18 ENCOUNTER — Encounter: Payer: Self-pay | Admitting: Adult Health

## 2018-12-18 ENCOUNTER — Ambulatory Visit: Payer: BC Managed Care – PPO | Admitting: Adult Health

## 2018-12-18 VITALS — BP 122/72 | HR 56 | Temp 97.7°F | Resp 16 | Ht 67.0 in | Wt 308.0 lb

## 2018-12-18 DIAGNOSIS — J0110 Acute frontal sinusitis, unspecified: Secondary | ICD-10-CM | POA: Diagnosis not present

## 2018-12-18 DIAGNOSIS — R001 Bradycardia, unspecified: Secondary | ICD-10-CM

## 2018-12-18 DIAGNOSIS — F17219 Nicotine dependence, cigarettes, with unspecified nicotine-induced disorders: Secondary | ICD-10-CM

## 2018-12-18 DIAGNOSIS — K219 Gastro-esophageal reflux disease without esophagitis: Secondary | ICD-10-CM

## 2018-12-18 MED ORDER — AMOXICILLIN-POT CLAVULANATE 875-125 MG PO TABS: 1.0000 | ORAL_TABLET | Freq: Two times a day (BID) | ORAL | 0 refills | Status: DC

## 2018-12-18 NOTE — Progress Notes (Signed)
[facility] [place], [place] [address]  Internal MEDICINE  Office Visit Note  Patient Name: Marissa Gutierrez  [id number]  [id number]  Date of Service: 12/18/2018  Chief Complaint  Patient presents with  . Sinusitis    started yesterday congestion and headache   . Cough     HPI Pt is here for a sick visit. Pt reports PND and headache that has been going on for a couple days.  She feels sinus pain and pressure that is extreme.  She denies fever, chills, or dizziness.    Current Medication:  Outpatient Encounter Medications as of 12/18/2018  Medication Sig  . amoxicillin-clavulanate (AUGMENTIN) 875-125 MG tablet Take 1 tablet by mouth 2 (two) times daily.  . [DISCONTINUED] sertraline (ZOLOFT) 50 MG tablet Take 1 tablet (50 mg total) by mouth daily. (Patient not taking: Reported on 12/18/2018)   No facility-administered encounter medications on file as of 12/18/2018.       Medical History: Past Medical History:  Diagnosis Date  . GERD (gastroesophageal reflux disease)      Vital Signs: BP 122/72   Pulse (!) 56   Temp 97.7 F (36.5 C)   Resp 16   Ht 5\' 7"  (1.702 m)   Wt (!) 308 lb (139.7 kg)   SpO2 97%   BMI 48.24 kg/m    Review of Systems  Constitutional: Negative for chills, fatigue and unexpected weight change.  HENT: Positive for postnasal drip, sinus pressure and sinus pain. Negative for congestion, rhinorrhea and sneezing.   Eyes: Negative for photophobia, pain and redness.  Respiratory: Negative for cough, chest tightness and shortness of breath.   Cardiovascular: Negative for chest pain and palpitations.  Gastrointestinal: Negative for abdominal pain, constipation, diarrhea, nausea and vomiting.  Endocrine: Negative.   Genitourinary: Negative for dysuria and frequency.  Musculoskeletal: Negative for arthralgias, back pain, joint swelling and neck pain.  Skin: Negative for rash.  Allergic/Immunologic: Negative.   Neurological:  Negative for tremors and numbness.  Hematological: Negative for adenopathy. Does not bruise/bleed easily.  Psychiatric/Behavioral: Negative for behavioral problems and sleep disturbance. The patient is not nervous/anxious.     Physical Exam Vitals signs and nursing note reviewed.  Constitutional:      General: She is not in acute distress.    Appearance: She is well-developed. She is not diaphoretic.  HENT:     Head: Normocephalic and atraumatic.     Mouth/Throat:     Pharynx: No oropharyngeal exudate.  Eyes:     Pupils: Pupils are equal, round, and reactive to light.  Neck:     Musculoskeletal: Normal range of motion and neck supple.     Thyroid: No thyromegaly.     Vascular: No JVD.     Trachea: No tracheal deviation.  Cardiovascular:     Rate and Rhythm: Normal rate and regular rhythm.     Heart sounds: Normal heart sounds. No murmur. No friction rub. No gallop.   Pulmonary:     Effort: Pulmonary effort is normal. No respiratory distress.     Breath sounds: Normal breath sounds. No wheezing or rales.  Chest:     Chest wall: No tenderness.  Abdominal:     Palpations: Abdomen is soft.     Tenderness: There is no abdominal tenderness. There is no guarding.  Musculoskeletal: Normal range of motion.  Lymphadenopathy:     Cervical: No cervical adenopathy.  Skin:    General: Skin is warm and dry.  Neurological:  Mental Status: She is alert and oriented to person, place, and time.     Cranial Nerves: No cranial nerve deficit.  Psychiatric:        Behavior: Behavior normal.        Thought Content: Thought content normal.        Judgment: Judgment normal.    Assessment/Plan: 1. Acute non-recurrent frontal sinusitis Advised patient to take entire course of antibiotics as prescribed with food. Pt should return to clinic in 7-10 days if symptoms fail to improve or new symptoms develop.  - amoxicillin-clavulanate (AUGMENTIN) 875-125 MG tablet; Take 1 tablet by mouth 2 (two)  times daily.  Dispense: 14 tablet; Refill: 0  2. Bradycardia HR initially 56, rechecked and found to be 68, Regular.  Will continue to monitor at future visits.   3. Gastroesophageal reflux disease, esophagitis presence not specified Pt has been having symptoms.  Continue to use medication as prescribed.   4. Cigarette nicotine dependence with nicotine-induced disorder Smoking cessation counseling: 1. Pt acknowledges the risks of long term smoking, she will try to quite smoking. 2. Options for different medications including nicotine products, chewing gum, patch etc, Wellbutrin and Chantix is discussed 3. Goal and date of compete cessation is discussed 4. Total time spent in smoking cessation is 15 min.   General Counseling: Marissa Gutierrez verbalizes understanding of the findings of todays visit and agrees with plan of treatment. I have discussed any further diagnostic evaluation that may be needed or ordered today. We also reviewed her medications today. she has been encouraged to call the office with any questions or concerns that should arise related to todays visit.   No orders of the defined types were placed in this encounter.   Meds ordered this encounter  Medications  . amoxicillin-clavulanate (AUGMENTIN) 875-125 MG tablet    Sig: Take 1 tablet by mouth 2 (two) times daily.    Dispense:  14 tablet    Refill:  0    Time spent: 25 Minutes  This patient was seen by Orson Gear AGNP-C in Collaboration with Dr Lavera Guise as a part of collaborative care agreement.  Kendell Bane AGNP-C Internal Medicine

## 2018-12-18 NOTE — Patient Instructions (Signed)

## 2019-04-03 ENCOUNTER — Ambulatory Visit: Payer: Self-pay | Admitting: Adult Health

## 2019-08-19 ENCOUNTER — Other Ambulatory Visit: Payer: Self-pay

## 2019-08-19 ENCOUNTER — Ambulatory Visit
Admission: RE | Admit: 2019-08-19 | Discharge: 2019-08-19 | Disposition: A | Discharge status: Home / Self Care | Payer: BC Managed Care – PPO | Source: Ambulatory Visit | Attending: Adult Health | Admitting: Adult Health

## 2019-08-19 ENCOUNTER — Encounter: Payer: Self-pay | Admitting: Emergency Medicine

## 2019-08-19 ENCOUNTER — Encounter: Payer: Self-pay | Admitting: Adult Health

## 2019-08-19 ENCOUNTER — Emergency Department
Admission: EM | Admit: 2019-08-19 | Discharge: 2019-08-19 | Disposition: A | Discharge status: Home / Self Care | Payer: BC Managed Care – PPO | Attending: Student in an Organized Health Care Education/Training Program | Admitting: Student in an Organized Health Care Education/Training Program

## 2019-08-19 ENCOUNTER — Ambulatory Visit: Payer: BC Managed Care – PPO | Admitting: Adult Health

## 2019-08-19 VITALS — BP 133/75 | HR 82 | Temp 97.6°F | Resp 16 | Ht 67.0 in | Wt 300.4 lb

## 2019-08-19 DIAGNOSIS — I809 Phlebitis and thrombophlebitis of unspecified site: Secondary | ICD-10-CM | POA: Diagnosis not present

## 2019-08-19 DIAGNOSIS — M7122 Synovial cyst of popliteal space [Baker], left knee: Secondary | ICD-10-CM | POA: Diagnosis not present

## 2019-08-19 DIAGNOSIS — F17200 Nicotine dependence, unspecified, uncomplicated: Secondary | ICD-10-CM | POA: Insufficient documentation

## 2019-08-19 DIAGNOSIS — I8002 Phlebitis and thrombophlebitis of superficial vessels of left lower extremity: Secondary | ICD-10-CM | POA: Diagnosis not present

## 2019-08-19 DIAGNOSIS — I82812 Embolism and thrombosis of superficial veins of left lower extremities: Secondary | ICD-10-CM | POA: Diagnosis not present

## 2019-08-19 DIAGNOSIS — M7989 Other specified soft tissue disorders: Secondary | ICD-10-CM

## 2019-08-19 DIAGNOSIS — F17219 Nicotine dependence, cigarettes, with unspecified nicotine-induced disorders: Secondary | ICD-10-CM | POA: Diagnosis not present

## 2019-08-19 DIAGNOSIS — M79605 Pain in left leg: Secondary | ICD-10-CM | POA: Diagnosis not present

## 2019-08-19 NOTE — ED Triage Notes (Addendum)
Patient to ER for c/o "clot" in left leg. Was sent here by PCP for further evaluation/treatment. Ultrasound results show no DVT, +superficial thrombophlebitis.

## 2019-08-19 NOTE — Discharge Instructions (Signed)
Rest, elevate and apply heat off and on throughout the day.  Take Aspirin daily.   I do recommend a follow up ultrasound in about a week to make sure it is getting smaller.   Return to the ER for symptoms that change or worsen or for new concerns if unable to schedule an appointment.

## 2019-08-19 NOTE — ED Provider Notes (Signed)
[facility] Emergency Department Provider Note ____________________________________________   None    (approximate)  I have reviewed the triage vital signs and the nursing notes.   HISTORY  Chief Complaint Leg Pain  HPI Marissa Gutierrez is a 53 y.o. female who presents to the emergency department upon the request of her primary care provider for treatment and evaluation of a "blood clot" in the left lower extremity. She states that she was evaluated by her primary care provider today and was sent for an ultrasound.  Once the results were back, the patient was contacted and advised that she needed to come to the emergency department.  Symptoms started a few days ago and have progressively worsened.  No alleviating measures attempted prior to arrival.   Past Medical History:  Diagnosis Date  . GERD (gastroesophageal reflux disease)     Patient Active Problem List   Diagnosis Date Noted  . Morbid obesity ([place]) 11/02/2015  . GERD (gastroesophageal reflux disease) 11/02/2015  . Cough 11/02/2015  . Tobacco abuse 11/02/2015    Past Surgical History:  Procedure Laterality Date  . CHOLECYSTECTOMY    . DILATION AND CURETTAGE OF UTERUS  1989  . TOOTH EXTRACTION    . WISDOM TOOTH EXTRACTION      Prior to Admission medications   Not on File    Allergies Naproxen sodium  Family History  Problem Relation Age of Onset  . Arthritis Mother   . Lung cancer Father   . Heart disease Maternal Grandfather     Social History Social History   Tobacco Use  . Smoking status: Current Every Day Smoker  . Smokeless tobacco: Never Used  Substance Use Topics  . Alcohol use: Not Currently  . Drug use: No    Review of Systems  Constitutional: No fever/chills Eyes: No visual changes. ENT: No sore throat. Cardiovascular: Denies chest pain. Respiratory: Denies shortness of breath. Gastrointestinal: No abdominal pain.  No nausea, no vomiting.  No diarrhea.  No  constipation. Genitourinary: Negative for dysuria. Musculoskeletal: Negative for back pain. Skin: Positive for erythematous, indurated area on the medial aspect of the left lower extremity at the knee and below. Neurological: Negative  Headaches, focal weakness or numbness. ____________________________________________   PHYSICAL EXAM:  VITAL SIGNS: ED Triage Vitals  Enc Vitals Group     BP 08/19/19 2021 (!) 159/74     Pulse Rate 08/19/19 2021 73     Resp 08/19/19 2021 18     Temp 08/19/19 2021 98.9 F (37.2 C)     Temp Source 08/19/19 2021 Oral     SpO2 08/19/19 2021 98 %     Weight 08/19/19 2021 300 lb (136.1 kg)     Height 08/19/19 2021 5\' 7"  (1.702 m)     Head Circumference --      Peak Flow --      Pain Score 08/19/19 2029 7     Pain Loc --      Pain Edu? --      Excl. in [place]? --     Constitutional: Alert and oriented. Well appearing and in no acute distress. Eyes: Conjunctivae are normal.  Head: Atraumatic. Nose: No congestion/rhinnorhea. Mouth/Throat: Mucous membranes are moist.  Neck: No stridor.   Hematological/Lymphatic/Immunilogical: No cervical lymphadenopathy. Cardiovascular: Normal rate, regular rhythm. Grossly normal heart sounds.  Good peripheral circulation. Respiratory: Normal respiratory effort.  No retractions. Lungs CTAB. Gastrointestinal: Soft and nontender. Genitourinary:  Musculoskeletal: No restriction of movement of the left  lower extremity Neurologic:  Normal speech and language. No gross focal neurologic deficits are appreciated. No gait instability. Skin: Warm, dry, intact.  Erythematous, indurated area at the knee and just above and below.  No lymphangitis.Marland Kitchen Psychiatric: Mood and affect are normal. Speech and behavior are normal.  ____________________________________________   LABS (all labs ordered are listed, but only abnormal results are displayed)  Labs Reviewed - No data to display ____________________________________________  EKG   Not indicated ____________________________________________  RADIOLOGY  ED MD interpretation:    Ultrasound results from earlier today show a thrombophlebitis of the greater saphenous vein as well as Baker's cyst of the left lower extremity  Official radiology report(s): US Venous Img Lower Unilateral Left  Result Date: 08/19/2019 CLINICAL DATA:  Left leg pain and swelling EXAM: LEFT LOWER EXTREMITY VENOUS DOPPLER ULTRASOUND TECHNIQUE: Gray-scale sonography with graded compression, as well as color Doppler and duplex ultrasound were performed to evaluate the lower extremity deep venous systems from the level of the common femoral vein and including the common femoral, femoral, profunda femoral, popliteal and calf veins including the posterior tibial, peroneal and gastrocnemius veins when visible. The superficial great saphenous vein was also interrogated. Spectral Doppler was utilized to evaluate flow at rest and with distal augmentation maneuvers in the common femoral, femoral and popliteal veins. COMPARISON:  None. FINDINGS: Contralateral Common Femoral Vein: Respiratory phasicity is normal and symmetric with the symptomatic side. No evidence of thrombus. Normal compressibility. Common Femoral Vein: No evidence of thrombus. Normal compressibility, respiratory phasicity and response to augmentation. Saphenofemoral Junction: No evidence of thrombus. Normal compressibility and flow on color Doppler imaging. Profunda Femoral Vein: No evidence of thrombus. Normal compressibility and flow on color Doppler imaging. Femoral Vein: No evidence of thrombus. Normal compressibility, respiratory phasicity and response to augmentation. Popliteal Vein: No evidence of thrombus. Normal compressibility, respiratory phasicity and response to augmentation. Calf Veins: No evidence of thrombus. Normal compressibility and flow on color Doppler imaging. Superficial Great Saphenous Vein: Intraluminal thrombus present from  the proximal thigh to the mid calf compatible with GSV superficial thrombosis/thrombophlebitis. Venous Reflux:  Not assessed Other Findings: Popliteal fossa ill-defined fluid collection measures 3.1 x 1.2 x 1.9 cm compatible with a Baker's cyst. IMPRESSION: Negative for significant left lower extremity DVT. Left GSV superficial thrombosis/thrombophlebitis 3.1 cm left popliteal fossa Baker's cyst. Electronically Signed   By: Jerilynn Mages.  Shick M.D.   On: 08/19/2019 15:28    ____________________________________________   PROCEDURES  Procedure(s) performed (including Critical Care):  Procedures  ____________________________________________   INITIAL IMPRESSION / ASSESSMENT AND PLAN     53 year old female presenting to the emergency department after being prompted to do so by her primary care provider.  Ultrasound of the left lower extremity has been completed.  ED COURSE  Ultrasound shows a thrombophlebitis but no evidence of a DVT.  The patient has been advised to rest, apply heat intermittently throughout the day, wear compression stockings, and take NSAIDs or an aspirin every day.  She was also encouraged to follow-up with her primary care provider approximately 1 week with the recommendation of having a repeat ultrasound to make sure that the thrombophlebitis appears to be resolving.  Patient was also told that she could follow-up with orthopedics to have the Baker's cyst drained if it does in fact begin to cause her any issues.  She was advised to watch for any signs or symptoms of lymphangitis or cellulitis in the area as well.  If she is unable to schedule a follow-up appointment  with primary care and she has new or additional concerns, she was encouraged to return to the emergency department. ____________________________________________   FINAL CLINICAL IMPRESSION(S) / ED DIAGNOSES  Final diagnoses:  Thrombophlebitis of saphenous vein, left  Baker's cyst, left     ED Discharge Orders     None       Note:  This document was prepared using Dragon voice recognition software and may include unintentional dictation errors.   Victorino Dike, FNP 08/19/19 2207    Merlyn Lot, MD 08/19/19 2238

## 2019-08-19 NOTE — Progress Notes (Signed)
[facility] [place], [place] [address]  Internal MEDICINE  Office Visit Note  Patient Name: Marissa Gutierrez  [id number]  [id number]  Date of Service: 08/31/2019  Chief Complaint  Patient presents with  . Leg Pain    left leg pain, 2 big knots on the back of thigh and calf, the knots are hot, red, and hard; feels like when she had cellulitis.      HPI Pt is here for a sick visit.  She reports she noticed about a week ago some pain in the her posterior left leg.  She reports it has progressively become worse over the week.  About 4 days ago she noticed redness and she felt some hardened areas. She tried otc motrin, with no success.  She denies fever.  She does reports some tingling in the leg if she stands on it for too long.      Current Medication:  Outpatient Encounter Medications as of 08/19/2019  Medication Sig  . [DISCONTINUED] amoxicillin-clavulanate (AUGMENTIN) 875-125 MG tablet Take 1 tablet by mouth 2 (two) times daily. (Patient not taking: Reported on 08/19/2019)   No facility-administered encounter medications on file as of 08/19/2019.       Medical History: Past Medical History:  Diagnosis Date  . GERD (gastroesophageal reflux disease)      Vital Signs: BP 133/75   Pulse 82   Temp 97.6 F (36.4 C)   Resp 16   Ht 5\' 7"  (1.702 m)   Wt (!) 300 lb 6.4 oz (136.3 kg)   SpO2 98%   BMI 47.05 kg/m    Review of Systems  Constitutional: Negative for chills, fatigue and unexpected weight change.  HENT: Negative for congestion, rhinorrhea, sneezing and sore throat.   Eyes: Negative for photophobia, pain and redness.  Respiratory: Negative for cough, chest tightness and shortness of breath.   Cardiovascular: Negative for chest pain and palpitations.  Gastrointestinal: Negative for abdominal pain, constipation, diarrhea, nausea and vomiting.  Endocrine: Negative.   Genitourinary: Negative for dysuria and frequency.  Musculoskeletal:  Negative for arthralgias, back pain, joint swelling and neck pain.  Skin: Negative for rash.  Allergic/Immunologic: Negative.   Neurological: Negative for tremors and numbness.  Hematological: Negative for adenopathy. Does not bruise/bleed easily.  Psychiatric/Behavioral: Negative for behavioral problems and sleep disturbance. The patient is not nervous/anxious.     Physical Exam Vitals signs and nursing note reviewed.  Constitutional:      General: She is not in acute distress.    Appearance: She is well-developed. She is not diaphoretic.  HENT:     Head: Normocephalic and atraumatic.     Mouth/Throat:     Pharynx: No oropharyngeal exudate.  Eyes:     Pupils: Pupils are equal, round, and reactive to light.  Neck:     Musculoskeletal: Normal range of motion and neck supple.     Thyroid: No thyromegaly.     Vascular: No JVD.     Trachea: No tracheal deviation.  Cardiovascular:     Rate and Rhythm: Normal rate and regular rhythm.     Heart sounds: Normal heart sounds. No murmur. No friction rub. No gallop.   Pulmonary:     Effort: Pulmonary effort is normal. No respiratory distress.     Breath sounds: Normal breath sounds. No wheezing or rales.  Chest:     Chest wall: No tenderness.  Abdominal:     Palpations: Abdomen is soft.     Tenderness: There  is no abdominal tenderness. There is no guarding.  Musculoskeletal: Normal range of motion.  Lymphadenopathy:     Cervical: No cervical adenopathy.  Skin:    General: Skin is warm and dry.  Neurological:     Mental Status: She is alert and oriented to person, place, and time.     Cranial Nerves: No cranial nerve deficit.  Psychiatric:        Behavior: Behavior normal.        Thought Content: Thought content normal.        Judgment: Judgment normal.    Assessment/Plan: 1. Phlebitis Pt will need ultrasound to rule out DVT.   2. Swelling of lower leg - US Venous Img Lower Unilateral Left; Future  3. Cigarette nicotine  dependence with nicotine-induced disorder Smoking cessation counseling: 1. Pt acknowledges the risks of long term smoking, she will try to quite smoking. 2. Options for different medications including nicotine products, chewing gum, patch etc, Wellbutrin and Chantix is discussed 3. Goal and date of compete cessation is discussed 4. Total time spent in smoking cessation is 15 min.   General Counseling: Aiyla verbalizes understanding of the findings of todays visit and agrees with plan of treatment. I have discussed any further diagnostic evaluation that may be needed or ordered today. We also reviewed her medications today. she has been encouraged to call the office with any questions or concerns that should arise related to todays visit.   Orders Placed This Encounter  Procedures  . US Venous Img Lower Unilateral Left    No orders of the defined types were placed in this encounter.   Time spent: 25 Minutes  This patient was seen by Orson Gear AGNP-C in Collaboration with Dr Lavera Guise as a part of collaborative care agreement.  Kendell Bane AGNP-C Internal Medicine

## 2019-08-20 ENCOUNTER — Telehealth: Payer: Self-pay

## 2019-08-20 NOTE — Telephone Encounter (Signed)
Spoke with patient in regards to yesterdays visit at Emergency Room, pt states that she was seen in [facility] for [place] and was sent to ED by [place] , pt states that the ER did not understand why patient was sent there , emergency room did nothing for her but to tell her to rest , use warm compresses , Asprin and or Nsaids, compression socks, should resolve on own and to follow up with pcp in one week if not resolved another [place] of leg should be done.Pt is upset because she feels she wasted time and money on something that could have been told to her by her doctor over the phone.  Pt states that it still hurting but she is resting and following recommendations of ED, Pt will call us back to schedule appt in one week for follow up.

## 2019-08-20 NOTE — Telephone Encounter (Signed)
Called patient to check on her to see how she was doing following ED yesterday, lmov for patient to return call.

## 2019-08-22 NOTE — Telephone Encounter (Signed)
Pt advised on making follow up appointment

## 2019-08-23 ENCOUNTER — Telehealth: Payer: Self-pay | Admitting: Internal Medicine

## 2019-09-11 NOTE — Telephone Encounter (Signed)
She was suppose to come in for follow up in one week.  I know she was upset, but It doesn't look like we saw her.  Can one of you (jody, Bennie Dallas) check to see if she is better.  Talk to me before you call her. Thank you

## 2019-09-17 ENCOUNTER — Telehealth: Payer: Self-pay

## 2019-09-17 NOTE — Telephone Encounter (Signed)
Lm for patient in regards to she did not come to one week follow up , and wanted to see how she was doing . Lm for patient to please call back

## 2019-09-17 NOTE — Telephone Encounter (Signed)
Called pt left message for patient to call us ,advised on missing week follow up and wanting to see how she was

## 2019-12-14 DIAGNOSIS — J029 Acute pharyngitis, unspecified: Secondary | ICD-10-CM | POA: Diagnosis not present

## 2019-12-14 DIAGNOSIS — R197 Diarrhea, unspecified: Secondary | ICD-10-CM | POA: Diagnosis not present

## 2019-12-14 DIAGNOSIS — Z1152 Encounter for screening for COVID-19: Secondary | ICD-10-CM | POA: Diagnosis not present

## 2019-12-14 DIAGNOSIS — R509 Fever, unspecified: Secondary | ICD-10-CM | POA: Diagnosis not present

## 2019-12-14 DIAGNOSIS — R110 Nausea: Secondary | ICD-10-CM | POA: Diagnosis not present

## 2019-12-28 IMAGING — US US EXTREM LOW VENOUS*L*
1 series · 13 of 24 positions shown · non-contrast
Comparison: None.

CLINICAL DATA: Left leg pain and swelling



[Series 1: us extrem low venous*left* · 0.08mm/px · 43 acquisitions, 13 frames shown]
[im 1/43]
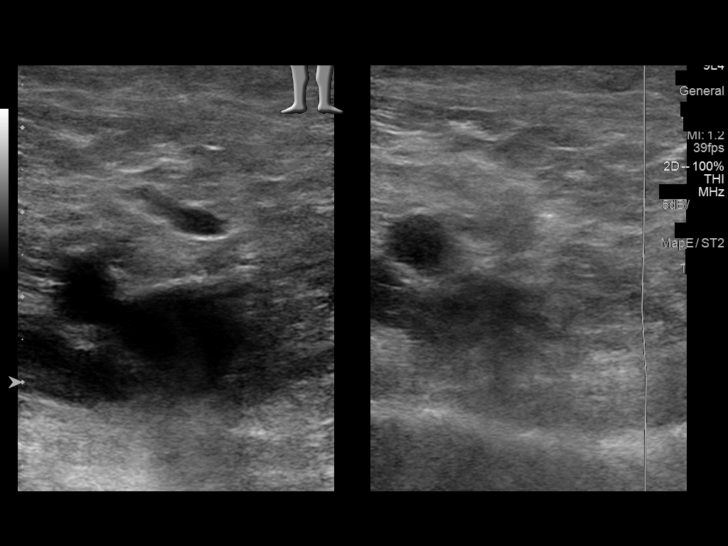
[im 4/43]
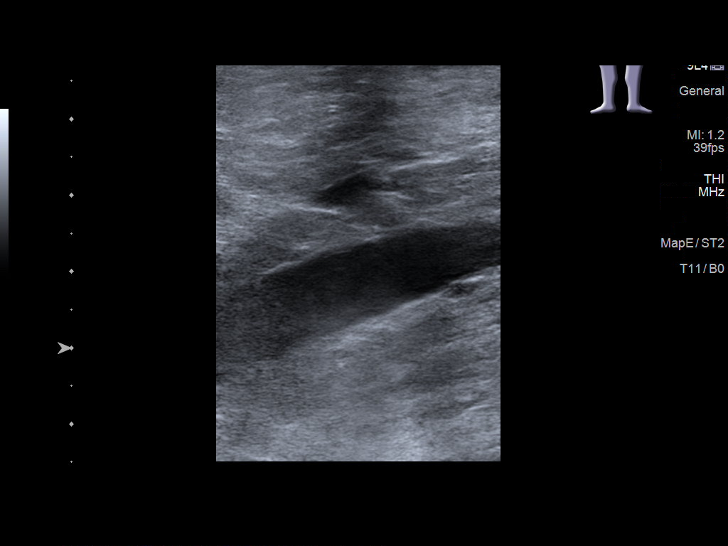
[im 6/43]
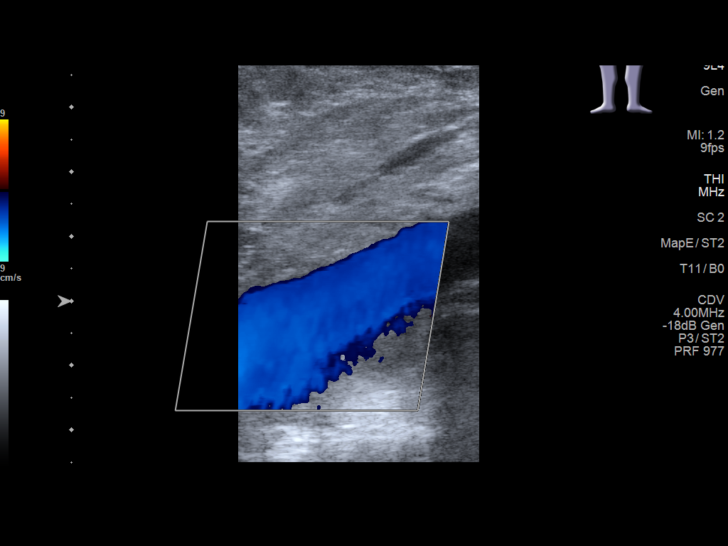
[im 10/43]
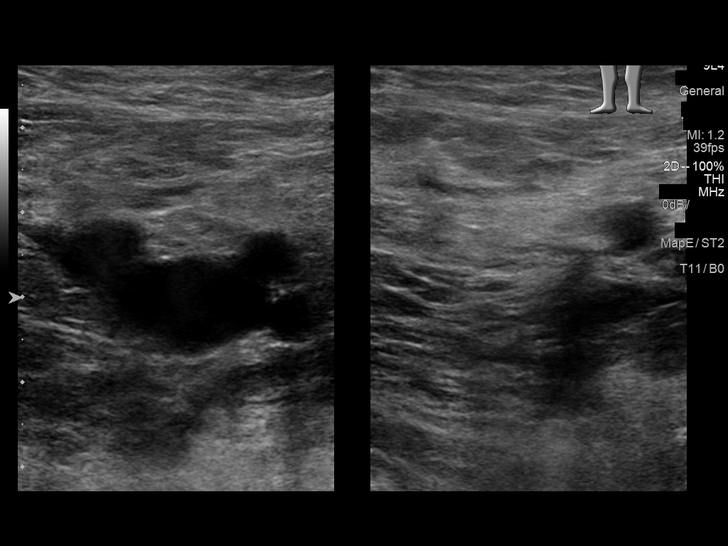
[im 13/43]
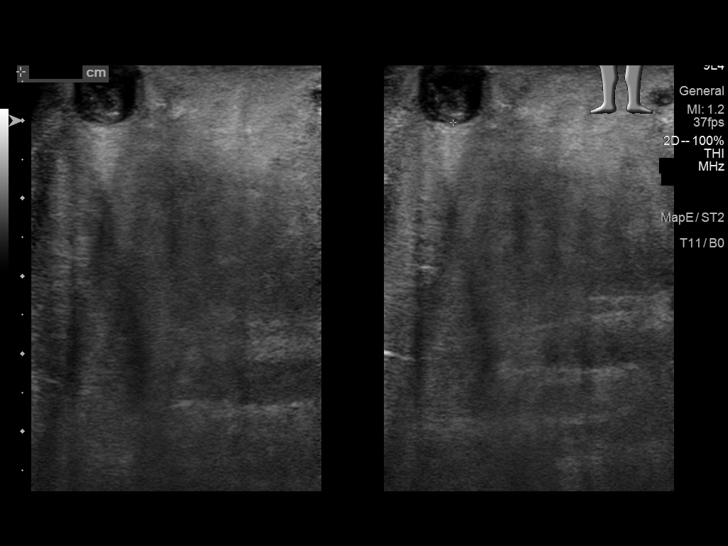
[im 17/43]
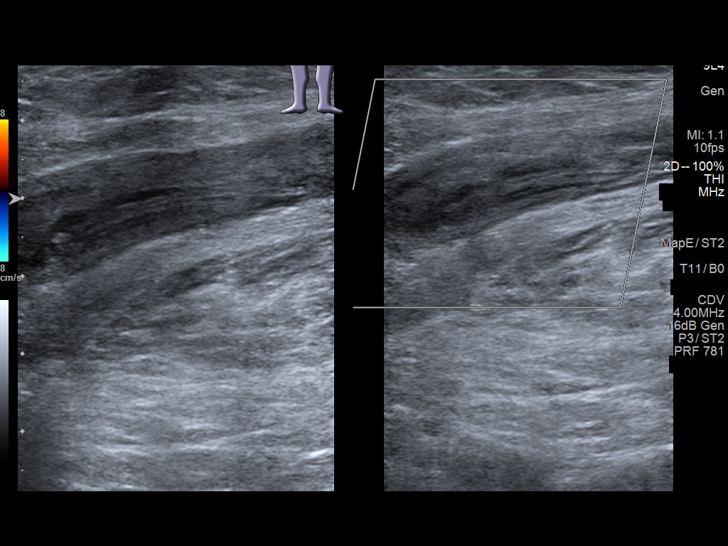
[im 22/43]
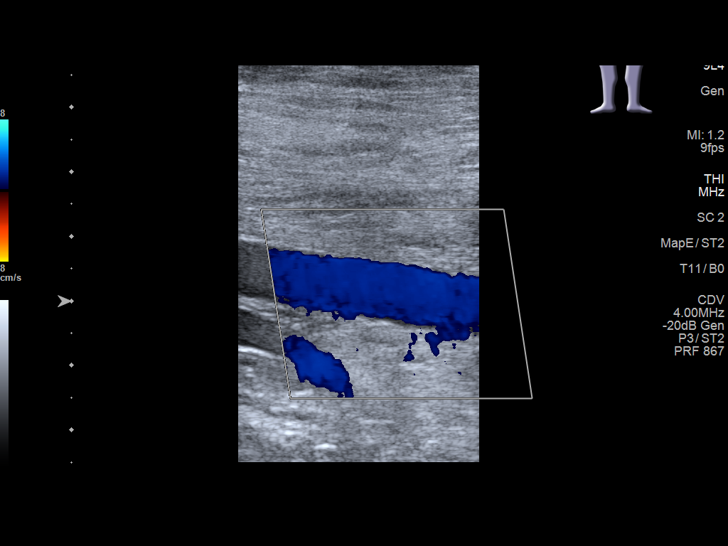
[im 24/43]
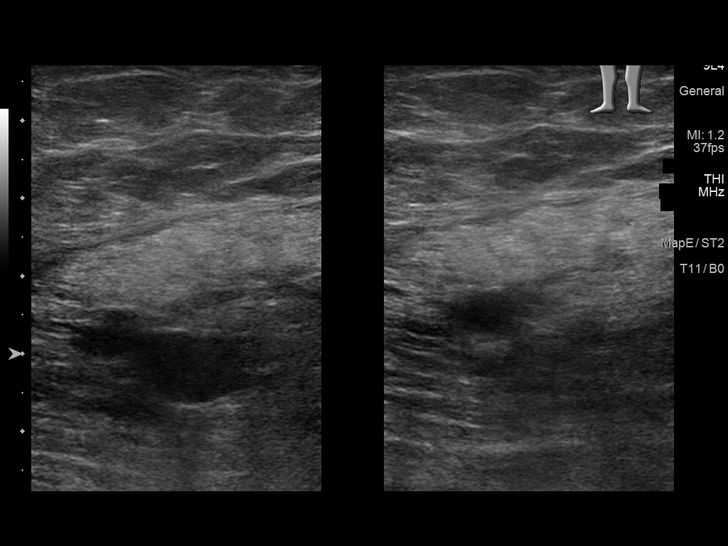
[im 28/43]
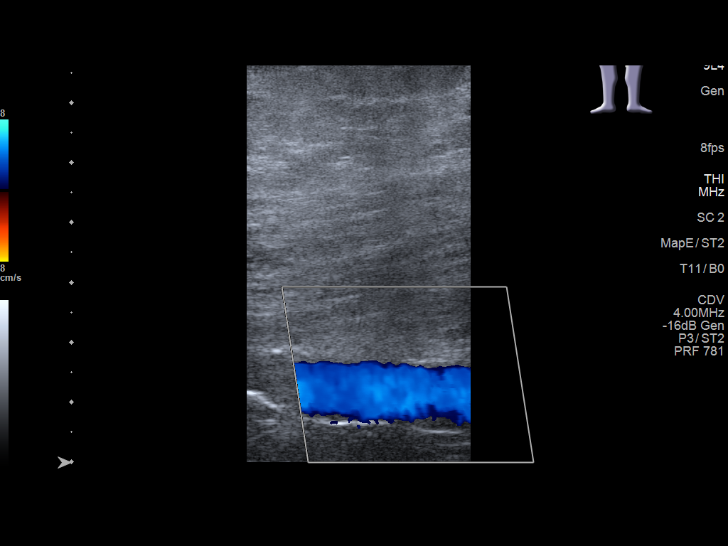
[im 32/43]
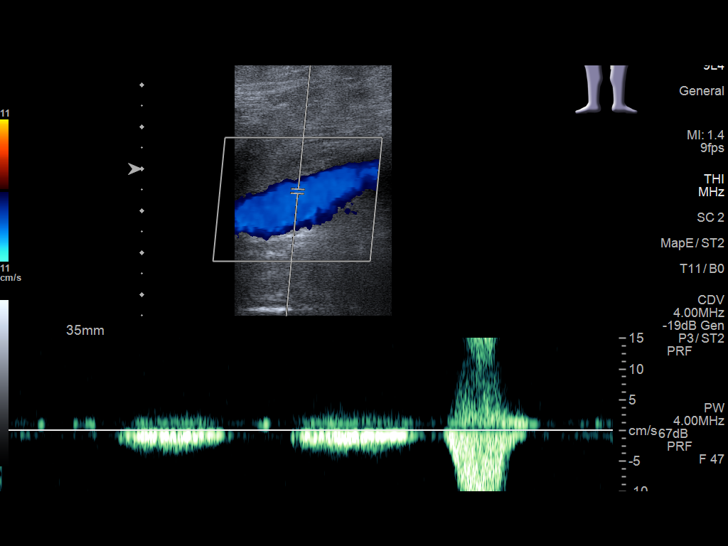
[im 35/43]
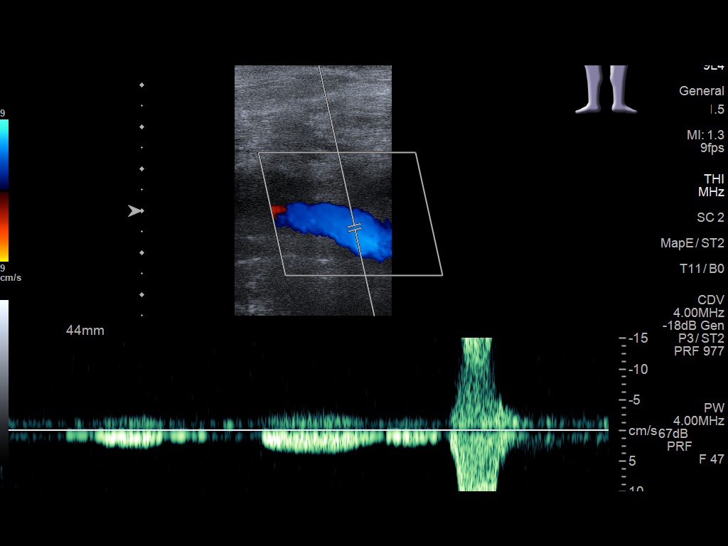
[im 39/43]
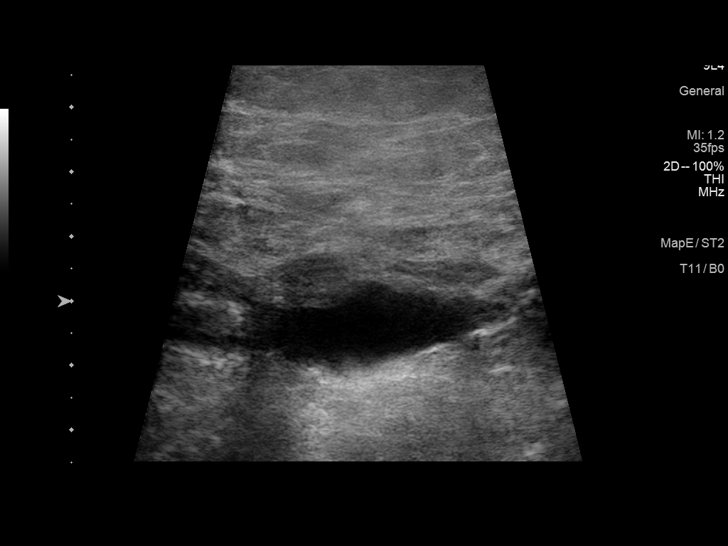
[im 43/43]
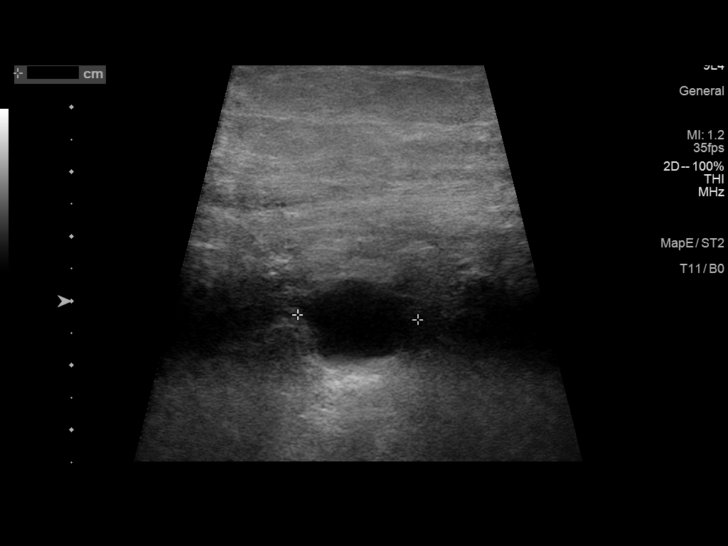

[13 of 24 positions shown; findings below may reference images not displayed]

FINDINGS: Contralateral Common Femoral Vein: Respiratory phasicity is normal
and symmetric with the symptomatic side. No evidence of thrombus.
Normal compressibility.

Common Femoral Vein: No evidence of thrombus. Normal
compressibility, respiratory phasicity and response to augmentation.

Saphenofemoral Junction: No evidence of thrombus. Normal
compressibility and flow on color Doppler imaging.

Profunda Femoral Vein: No evidence of thrombus. Normal
compressibility and flow on color Doppler imaging.

Femoral Vein: No evidence of thrombus. Normal compressibility,
respiratory phasicity and response to augmentation.

Popliteal Vein: No evidence of thrombus. Normal compressibility,
respiratory phasicity and response to augmentation.

Calf Veins: No evidence of thrombus. Normal compressibility and flow
on color Doppler imaging.

Superficial Great Saphenous Vein: Intraluminal thrombus present from
the proximal thigh to the mid calf compatible with GSV superficial
thrombosis/thrombophlebitis.

Venous Reflux:  Not assessed

Other Findings: Popliteal fossa ill-defined fluid collection
measures 3.1 x 1.2 x 1.9 cm compatible with a Baker's cyst.
IMPRESSION: Negative for significant left lower extremity DVT.

Left GSV superficial thrombosis/thrombophlebitis

3.1 cm left popliteal fossa Baker's cyst.

## 2020-09-16 DIAGNOSIS — Z7189 Other specified counseling: Secondary | ICD-10-CM | POA: Diagnosis not present

## 2020-09-16 DIAGNOSIS — R197 Diarrhea, unspecified: Secondary | ICD-10-CM | POA: Diagnosis not present

## 2020-09-16 DIAGNOSIS — Z1389 Encounter for screening for other disorder: Secondary | ICD-10-CM | POA: Diagnosis not present

## 2020-09-16 DIAGNOSIS — R1900 Intra-abdominal and pelvic swelling, mass and lump, unspecified site: Secondary | ICD-10-CM | POA: Diagnosis not present

## 2020-09-16 DIAGNOSIS — F17200 Nicotine dependence, unspecified, uncomplicated: Secondary | ICD-10-CM | POA: Diagnosis not present

## 2020-09-21 ENCOUNTER — Other Ambulatory Visit: Payer: Self-pay | Admitting: Family Medicine

## 2020-09-21 DIAGNOSIS — Z1231 Encounter for screening mammogram for malignant neoplasm of breast: Secondary | ICD-10-CM

## 2020-11-17 ENCOUNTER — Other Ambulatory Visit: Payer: Self-pay

## 2020-11-17 ENCOUNTER — Telehealth: Payer: Self-pay

## 2020-11-17 ENCOUNTER — Encounter: Payer: Self-pay | Admitting: Gastroenterology

## 2020-11-17 ENCOUNTER — Ambulatory Visit: Payer: BC Managed Care – PPO | Admitting: Gastroenterology

## 2020-11-17 VITALS — BP 144/83 | HR 64 | Temp 97.6°F | Wt 318.0 lb

## 2020-11-17 DIAGNOSIS — K420 Umbilical hernia with obstruction, without gangrene: Secondary | ICD-10-CM

## 2020-11-17 DIAGNOSIS — K760 Fatty (change of) liver, not elsewhere classified: Secondary | ICD-10-CM | POA: Diagnosis not present

## 2020-11-17 DIAGNOSIS — R197 Diarrhea, unspecified: Secondary | ICD-10-CM

## 2020-11-17 DIAGNOSIS — Z1211 Encounter for screening for malignant neoplasm of colon: Secondary | ICD-10-CM

## 2020-11-17 MED ORDER — NA SULFATE-K SULFATE-MG SULF 17.5-3.13-1.6 GM/177ML PO SOLN: ORAL | 0 refills | Status: DC

## 2020-11-17 NOTE — Telephone Encounter (Signed)
Referral was faxed to [facility] [place] [facility] for peri umbilical hernia. They will contact patient to schedule consult.

## 2020-11-17 NOTE — Addendum Note (Signed)
Addended by: Wayna Chalet on: 11/17/2020 03:01 PM   Modules accepted: Orders

## 2020-11-17 NOTE — Progress Notes (Signed)
Marissa Gutierrez [address]  [place]  [place], [place] [address]  Main: [phone]  Fax: [phone]   Gastroenterology Consultation  Referring Provider:     Bunnie Pion, FNP Primary Care Physician:  Lavera Guise, MD Reason for Consultation:     Diarrhea        HPI:   Chief complaint: Diarrhea  Marissa Gutierrez is a 55 y.o. y/o female referred for consultation & management  by Dr. Humphrey Rolls, Timoteo Gaul, MD.  Patient reports about 9 months history of nonbloody diarrhea, taking Imodium intermittently.  Reports multiple loose stools a day, sometimes up to 12 a day.  Takes Imodium intermittently when she has to leave the house.  Also reports some episodes of fecal incontinence.  Also reports that she was told she has a mid abdomen hernia from previous cholecystectomy.  This is not painful.  She states she is also due for screening colonoscopy  History of cholecystectomy 15 years ago but did not have diarrhea until about 6 to 9 months ago.  No family history of colon cancer.  No nausea or vomiting.  I personally reviewed her previous records.  Patient had a CT abdomen pelvis in 2016 that showed, fatty liver, absence of gallbladder, large periumbilical fat and vessel containing hernia  Past Medical History:  Diagnosis Date  . GERD (gastroesophageal reflux disease)     Past Surgical History:  Procedure Laterality Date  . CHOLECYSTECTOMY    . DILATION AND CURETTAGE OF UTERUS  1989  . TOOTH EXTRACTION    . WISDOM TOOTH EXTRACTION      Prior to Admission medications   Not on File    Family History  Problem Relation Age of Onset  . Arthritis Mother   . Lung cancer Father   . Heart disease Maternal Grandfather      Social History   Tobacco Use  . Smoking status: Current Every Day Smoker  . Smokeless tobacco: Never Used  Vaping Use  . Vaping Use: Never used  Substance Use Topics  . Alcohol use: Not Currently  . Drug use: No    Allergies as of 11/17/2020 -  Review Complete 08/19/2019  Allergen Reaction Noted  . Naproxen sodium Shortness Of Breath 09/15/2014    Review of Systems:    All systems reviewed and negative except where noted in HPI.   Physical Exam:   Vitals:   11/17/20 1419  BP: (!) 144/83  Pulse: 64  Temp: 97.6 F (36.4 C)  TempSrc: Oral  Weight: (!) 318 lb (144.2 kg)   No LMP recorded. (Menstrual status: Perimenopausal). Psych:  Alert and cooperative. Normal mood and affect. General:   Alert,  Well-developed, well-nourished, pleasant and cooperative in NAD Head:  Normocephalic and atraumatic. Eyes:  Sclera clear, no icterus.   Conjunctiva pink. Ears:  Normal auditory acuity. Nose:  No deformity, discharge, or lesions. Mouth:  No deformity or lesions,oropharynx pink & moist. Neck:  Supple; no masses or thyromegaly. Abdomen:  Normal bowel sounds.  No bruits.  Soft, non-tender and non-distended without masses, hepatosplenomegaly, periumbilical nonincarcerated hernia noted.  No guarding or rebound tenderness.    Msk:  Symmetrical without gross deformities. Good, equal movement & strength bilaterally. Pulses:  Normal pulses noted. Extremities:  No clubbing or edema.  No cyanosis. Neurologic:  Alert and oriented x3;  grossly normal neurologically. Skin:  Intact without significant lesions or rashes. No jaundice. Lymph Nodes:  No significant cervical adenopathy. Psych:  Alert and cooperative.  Normal mood and affect.   Labs: CBC    Component Value Date/Time   WBC 12.1 (H) 01/26/2015 0517   RBC 4.65 01/26/2015 0517   HGB 15.4 01/26/2015 0517   HCT 45.1 01/26/2015 0517   PLT 192 01/26/2015 0517   MCV 97 01/26/2015 0517   MCH 33.0 01/26/2015 0517   MCHC 34.1 01/26/2015 0517   RDW 12.7 01/26/2015 0517   LYMPHSABS 1.5 01/26/2015 0517   MONOABS 0.6 01/26/2015 0517   EOSABS 0.3 01/26/2015 0517   BASOSABS 0.1 01/26/2015 0517   CMP     Component Value Date/Time   NA 135 01/26/2015 0517   K 3.8 01/26/2015 0517   CL  102 01/26/2015 0517   CO2 28 01/26/2015 0517   GLUCOSE 132 (H) 01/26/2015 0517   BUN 11 01/26/2015 0517   CREATININE 0.81 01/26/2015 0517   CALCIUM 8.7 (L) 01/26/2015 0517   PROT 7.5 01/26/2015 0517   ALBUMIN 3.8 01/26/2015 0517   AST 19 01/26/2015 0517   ALT 16 01/26/2015 0517   ALKPHOS 57 01/26/2015 0517   BILITOT 0.6 01/26/2015 0517   GFRNONAA >60 01/26/2015 0517   GFRAA >60 01/26/2015 0517    Imaging Studies: No results found.  Assessment and Plan:   Marissa Gutierrez is a 55 y.o. y/o female has been referred for diarrhea  Colonoscopy indicated for screening Obtain biopsies for microscopic colitis during the procedure  Referral to [place] [facility] for hernia  Fatty liver noted on CT scan previously Obtain liver enzymes at this time as they have not been obtained since 2016 If liver enzymes abnormal, obtain further work-up  Finding of fatty liver on imaging discussed with patient Diet, weight loss, and exercise encouraged along with avoiding hepatotoxic drugs including alcohol Risk of progression to cirrhosis if above measures are not instituted were discussed as well, and patient verbalized understanding    Dr Marissa Gutierrez  Speech recognition software was used to dictate the above note.

## 2020-11-18 LAB — HEPATIC FUNCTION PANEL
ALT: 27 IU/L (ref 0–32)
AST: 28 IU/L (ref 0–40)
Albumin: 3.7 g/dL — ABNORMAL LOW (ref 3.8–4.9)
Alkaline Phosphatase: 86 IU/L (ref 44–121)
Bilirubin Total: 0.4 mg/dL (ref 0.0–1.2)
Bilirubin, Direct: 0.11 mg/dL (ref 0.00–0.40)
Total Protein: 6.7 g/dL (ref 6.0–8.5)

## 2020-11-23 ENCOUNTER — Other Ambulatory Visit: Payer: BC Managed Care – PPO | Attending: Gastroenterology

## 2020-11-24 ENCOUNTER — Telehealth: Payer: Self-pay

## 2020-11-24 ENCOUNTER — Encounter: Payer: Self-pay | Admitting: Gastroenterology

## 2020-11-24 ENCOUNTER — Other Ambulatory Visit
Admission: RE | Admit: 2020-11-24 | Discharge: 2020-11-24 | Disposition: A | Discharge status: Home / Self Care | Payer: BC Managed Care – PPO | Source: Ambulatory Visit | Attending: Gastroenterology | Admitting: Gastroenterology

## 2020-11-24 ENCOUNTER — Other Ambulatory Visit: Payer: Self-pay

## 2020-11-24 DIAGNOSIS — Z01812 Encounter for preprocedural laboratory examination: Secondary | ICD-10-CM | POA: Insufficient documentation

## 2020-11-24 DIAGNOSIS — Z20822 Contact with and (suspected) exposure to covid-19: Secondary | ICD-10-CM | POA: Insufficient documentation

## 2020-11-24 DIAGNOSIS — Z1211 Encounter for screening for malignant neoplasm of colon: Secondary | ICD-10-CM | POA: Diagnosis not present

## 2020-11-24 DIAGNOSIS — Z801 Family history of malignant neoplasm of trachea, bronchus and lung: Secondary | ICD-10-CM | POA: Diagnosis not present

## 2020-11-24 DIAGNOSIS — Z6841 Body Mass Index (BMI) 40.0 and over, adult: Secondary | ICD-10-CM | POA: Diagnosis not present

## 2020-11-24 DIAGNOSIS — K648 Other hemorrhoids: Secondary | ICD-10-CM | POA: Diagnosis not present

## 2020-11-24 DIAGNOSIS — Z886 Allergy status to analgesic agent status: Secondary | ICD-10-CM | POA: Diagnosis not present

## 2020-11-24 DIAGNOSIS — K5730 Diverticulosis of large intestine without perforation or abscess without bleeding: Secondary | ICD-10-CM | POA: Diagnosis not present

## 2020-11-24 DIAGNOSIS — D120 Benign neoplasm of cecum: Secondary | ICD-10-CM | POA: Diagnosis not present

## 2020-11-24 DIAGNOSIS — K644 Residual hemorrhoidal skin tags: Secondary | ICD-10-CM | POA: Diagnosis not present

## 2020-11-24 DIAGNOSIS — Z8249 Family history of ischemic heart disease and other diseases of the circulatory system: Secondary | ICD-10-CM | POA: Diagnosis not present

## 2020-11-24 DIAGNOSIS — Z8261 Family history of arthritis: Secondary | ICD-10-CM | POA: Diagnosis not present

## 2020-11-24 DIAGNOSIS — D123 Benign neoplasm of transverse colon: Secondary | ICD-10-CM | POA: Diagnosis not present

## 2020-11-24 DIAGNOSIS — F17200 Nicotine dependence, unspecified, uncomplicated: Secondary | ICD-10-CM | POA: Diagnosis not present

## 2020-11-24 DIAGNOSIS — D125 Benign neoplasm of sigmoid colon: Secondary | ICD-10-CM | POA: Diagnosis not present

## 2020-11-24 NOTE — Telephone Encounter (Signed)
Returned patients call. Pt was not aware she had to take a covid test. She was able to get it done before they closed. Pt states endo unit told her to come in the morning for her procedure.

## 2020-11-25 ENCOUNTER — Ambulatory Visit: Payer: BC Managed Care – PPO | Admitting: Anesthesiology

## 2020-11-25 ENCOUNTER — Encounter: Payer: Self-pay | Admitting: Gastroenterology

## 2020-11-25 ENCOUNTER — Encounter
Admission: RE | Disposition: A | Discharge status: Home / Self Care | Payer: Self-pay | Source: Home / Self Care | Attending: Gastroenterology

## 2020-11-25 ENCOUNTER — Ambulatory Visit
Admission: RE | Admit: 2020-11-25 | Discharge: 2020-11-25 | Disposition: A | Discharge status: Home / Self Care | Payer: BC Managed Care – PPO | Attending: Gastroenterology | Admitting: Gastroenterology

## 2020-11-25 DIAGNOSIS — K635 Polyp of colon: Secondary | ICD-10-CM

## 2020-11-25 DIAGNOSIS — K648 Other hemorrhoids: Secondary | ICD-10-CM | POA: Insufficient documentation

## 2020-11-25 DIAGNOSIS — D123 Benign neoplasm of transverse colon: Secondary | ICD-10-CM | POA: Diagnosis not present

## 2020-11-25 DIAGNOSIS — Z20822 Contact with and (suspected) exposure to covid-19: Secondary | ICD-10-CM | POA: Diagnosis not present

## 2020-11-25 DIAGNOSIS — Z1211 Encounter for screening for malignant neoplasm of colon: Secondary | ICD-10-CM | POA: Diagnosis not present

## 2020-11-25 DIAGNOSIS — Z8261 Family history of arthritis: Secondary | ICD-10-CM | POA: Diagnosis not present

## 2020-11-25 DIAGNOSIS — Z886 Allergy status to analgesic agent status: Secondary | ICD-10-CM | POA: Insufficient documentation

## 2020-11-25 DIAGNOSIS — K5730 Diverticulosis of large intestine without perforation or abscess without bleeding: Secondary | ICD-10-CM | POA: Diagnosis not present

## 2020-11-25 DIAGNOSIS — Z6841 Body Mass Index (BMI) 40.0 and over, adult: Secondary | ICD-10-CM | POA: Insufficient documentation

## 2020-11-25 DIAGNOSIS — F17200 Nicotine dependence, unspecified, uncomplicated: Secondary | ICD-10-CM | POA: Insufficient documentation

## 2020-11-25 DIAGNOSIS — K219 Gastro-esophageal reflux disease without esophagitis: Secondary | ICD-10-CM | POA: Diagnosis not present

## 2020-11-25 DIAGNOSIS — K644 Residual hemorrhoidal skin tags: Secondary | ICD-10-CM | POA: Diagnosis not present

## 2020-11-25 DIAGNOSIS — Z801 Family history of malignant neoplasm of trachea, bronchus and lung: Secondary | ICD-10-CM | POA: Insufficient documentation

## 2020-11-25 DIAGNOSIS — D120 Benign neoplasm of cecum: Secondary | ICD-10-CM | POA: Diagnosis not present

## 2020-11-25 DIAGNOSIS — Z8249 Family history of ischemic heart disease and other diseases of the circulatory system: Secondary | ICD-10-CM | POA: Diagnosis not present

## 2020-11-25 DIAGNOSIS — D125 Benign neoplasm of sigmoid colon: Secondary | ICD-10-CM | POA: Diagnosis not present

## 2020-11-25 DIAGNOSIS — D126 Benign neoplasm of colon, unspecified: Secondary | ICD-10-CM | POA: Diagnosis not present

## 2020-11-25 HISTORY — PX: COLONOSCOPY WITH PROPOFOL: SHX5780

## 2020-11-25 LAB — POCT PREGNANCY, URINE: Preg Test, Ur: NEGATIVE

## 2020-11-25 LAB — SARS CORONAVIRUS 2 (TAT 6-24 HRS): SARS Coronavirus 2: NEGATIVE

## 2020-11-25 SURGERY — COLONOSCOPY WITH PROPOFOL
Anesthesia: General

## 2020-11-25 MED ORDER — SODIUM CHLORIDE 0.9 % IV SOLN: INTRAVENOUS | Status: DC

## 2020-11-25 MED ORDER — PHENYLEPHRINE HCL (PRESSORS) 10 MG/ML IV SOLN
INTRAVENOUS | Status: DC | PRN
  Administered 2020-11-25 (×4): 100 ug via INTRAVENOUS
  Administered 2020-11-25: 200 ug via INTRAVENOUS

## 2020-11-25 MED ORDER — SODIUM CHLORIDE (PF) 0.9 % IJ SOLN
INTRAMUSCULAR | Status: DC | PRN
  Administered 2020-11-25: 5 mL

## 2020-11-25 MED ORDER — PROPOFOL 10 MG/ML IV BOLUS
INTRAVENOUS | Status: DC | PRN
  Administered 2020-11-25: 60 mg via INTRAVENOUS

## 2020-11-25 MED ORDER — EPHEDRINE SULFATE 50 MG/ML IJ SOLN
INTRAMUSCULAR | Status: DC | PRN
  Administered 2020-11-25 (×3): 10 mg via INTRAVENOUS

## 2020-11-25 MED ORDER — PROPOFOL 500 MG/50ML IV EMUL
INTRAVENOUS | Status: DC | PRN
  Administered 2020-11-25: 200 ug/kg/min via INTRAVENOUS

## 2020-11-25 NOTE — Anesthesia Procedure Notes (Signed)
Date/Time: 11/25/2020 12:34 PM Performed by: Nelda Marseille, CRNA Pre-anesthesia Checklist: Patient identified, Emergency Drugs available, Suction available, Patient being monitored and Timeout performed Oxygen Delivery Method: Simple face mask

## 2020-11-25 NOTE — Anesthesia Preprocedure Evaluation (Signed)
Anesthesia Evaluation  Patient identified by MRN, date of birth, ID band Patient awake    Reviewed: Allergy & Precautions, H&P , NPO status , reviewed documented beta blocker date and time   Airway Mallampati: II  TM Distance: >3 FB Neck ROM: full    Dental  (+) Teeth Intact, Caps   Pulmonary Current Smoker and Patient abstained from smoking.,    Pulmonary exam normal        Cardiovascular Normal cardiovascular exam  RBBB on EKG   Neuro/Psych    GI/Hepatic GERD  Controlled,  Endo/Other  Morbid obesity  Renal/GU      Musculoskeletal   Abdominal   Peds  Hematology   Anesthesia Other Findings Past Medical History: No date: GERD (gastroesophageal reflux disease)  Past Surgical History: No date: CHOLECYSTECTOMY 1989: DILATION AND CURETTAGE OF UTERUS No date: TOOTH EXTRACTION No date: WISDOM TOOTH EXTRACTION  BMI    Body Mass Index: 49.34 kg/m      Reproductive/Obstetrics                             Anesthesia Physical Anesthesia Plan  ASA: III  Anesthesia Plan: General   Post-op Pain Management:    Induction: Intravenous  PONV Risk Score and Plan: Treatment may vary due to age or medical condition and TIVA  Airway Management Planned: Nasal Cannula and Natural Airway  Additional Equipment:   Intra-op Plan:   Post-operative Plan:   Informed Consent: I have reviewed the patients History and Physical, chart, labs and discussed the procedure including the risks, benefits and alternatives for the proposed anesthesia with the patient or authorized representative who has indicated his/her understanding and acceptance.     Dental Advisory Given  Plan Discussed with: CRNA  Anesthesia Plan Comments:         Anesthesia Quick Evaluation

## 2020-11-25 NOTE — Transfer of Care (Signed)
Immediate Anesthesia Transfer of Care Note  Patient: Marissa Gutierrez  Procedure(s) Performed: COLONOSCOPY WITH PROPOFOL (N/A )  Patient Location: PACU  Anesthesia Type:General  Level of Consciousness: awake, alert  and oriented  Airway & Oxygen Therapy: Patient Spontanous Breathing and Patient connected to nasal cannula oxygen  Post-op Assessment: Report given to RN and Post -op Vital signs reviewed and stable  Post vital signs: Reviewed and stable  Last Vitals:  Vitals Value Taken Time  BP 95/75 11/25/20 1303  Temp 36.2 C 11/25/20 1259  Pulse 83 11/25/20 1303  Resp 29 11/25/20 1303  SpO2 94 % 11/25/20 1303  Vitals shown include unvalidated device data.  Last Pain:  Vitals:   11/25/20 1259  TempSrc: Temporal  PainSc:          Complications: No complications documented.

## 2020-11-25 NOTE — H&P (Signed)
  Vonda Antigua, MD [address] [place], [place], [place], [address] [address], [place], [place], [place], [address] Phone: [phone]  Fax: [phone]  Primary Care Physician:  Bunnie Pion, FNP   Pre-Procedure History & Physical: HPI:  Marissa Gutierrez is a 55 y.o. female is here for a colonoscopy.   Past Medical History:  Diagnosis Date  . GERD (gastroesophageal reflux disease)     Past Surgical History:  Procedure Laterality Date  . CHOLECYSTECTOMY    . DILATION AND CURETTAGE OF UTERUS  1989  . TOOTH EXTRACTION    . WISDOM TOOTH EXTRACTION      Prior to Admission medications   Medication Sig Start Date End Date Taking? Authorizing Provider  Na Sulfate-K Sulfate-Mg Sulf 17.5-3.13-1.6 GM/177ML SOLN At 5 PM the day before procedure take 1 bottle and 5 hours before procedure take 1 bottle. 11/17/20  Yes Virgel Manifold, MD    Allergies as of 11/18/2020 - Review Complete 11/17/2020  Allergen Reaction Noted  . Naproxen sodium Shortness Of Breath 09/15/2014    Family History  Problem Relation Age of Onset  . Arthritis Mother   . Lung cancer Father   . Heart disease Maternal Grandfather     Social History   Socioeconomic History  . Marital status: Married    Spouse name: Not on file  . Number of children: Not on file  . Years of education: Not on file  . Highest education level: Not on file  Occupational History  . Not on file  Tobacco Use  . Smoking status: Current Every Day Smoker  . Smokeless tobacco: Never Used  Vaping Use  . Vaping Use: Never used  Substance and Sexual Activity  . Alcohol use: Not Currently  . Drug use: No  . Sexual activity: Yes    Birth control/protection: Condom  Other Topics Concern  . Not on file  Social History Narrative   Works for Nutritional therapist   One son- 77 yo   Social Determinants of Radio broadcast assistant Strain: Not on file  Food Insecurity: Not on file  Transportation Needs: Not on  file  Physical Activity: Not on file  Stress: Not on file  Social Connections: Not on file  Intimate Partner Violence: Not on file    Review of Systems: See HPI, otherwise negative ROS  Physical Exam: BP 124/79   Pulse 77   Temp (!) 96.7 F (35.9 C) (Temporal)   Resp 20   Ht 5\' 7"  (1.702 m)   Wt (!) 142.9 kg   SpO2 99%   BMI 49.34 kg/m  General:   Alert,  pleasant and cooperative in NAD Head:  Normocephalic and atraumatic. Neck:  Supple; no masses or thyromegaly. Lungs:  Clear throughout to auscultation, normal respiratory effort.    Heart:  +S1, +S2, Regular rate and rhythm, No edema. Abdomen:  Soft, nontender and nondistended. Normal bowel sounds, without guarding, and without rebound.   Neurologic:  Alert and  oriented x4;  grossly normal neurologically.  Impression/Plan: Marissa Gutierrez is here for a colonoscopy to be performed for average risk screening.  Risks, benefits, limitations, and alternatives regarding  colonoscopy have been reviewed with the patient.  Questions have been answered.  All parties agreeable.   Virgel Manifold, MD  11/25/2020, 11:01 AM

## 2020-11-25 NOTE — Op Note (Signed)
[facility] Gastroenterology Patient Name: Marissa Gutierrez Procedure Date: 11/25/2020 11:59 AM MRN: [id number] Account #: [id number] Date of Birth: 1966/09/16 Admit Type: Outpatient Age: 55 Room: [facility] ENDO ROOM 3 Gender: Female Note Status: Finalized Procedure:             Colonoscopy Indications:           Screening for colorectal malignant neoplasm Providers:             Collin Rengel B. Bonna Gains MD, MD Referring MD:          Hendricks Milo. MD Medicines:             Monitored Anesthesia Care Complications:         No immediate complications. Procedure:             Pre-Anesthesia Assessment:                        - ASA Grade Assessment: II - A patient with mild                         systemic disease.                        - Prior to the procedure, a History and Physical was                         performed, and patient medications, allergies and                         sensitivities were reviewed. The patient's tolerance                         of previous anesthesia was reviewed.                        - The risks and benefits of the procedure and the                         sedation options and risks were discussed with the                         patient. All questions were answered and informed                         consent was obtained.                        - Patient identification and proposed procedure were                         verified prior to the procedure by the physician, the                         nurse, the anesthesiologist, the anesthetist and the                         technician. The procedure was verified in the                         procedure room.  After obtaining informed consent, the colonoscope was                         passed under direct vision. Throughout the procedure,                         the patient's blood pressure, pulse, and oxygen                         saturations were monitored continuously.  The                         Colonoscope was introduced through the anus and                         advanced to the the cecum, identified by appendiceal                         orifice and ileocecal valve. The colonoscopy was                         performed with ease. The patient tolerated the                         procedure well. The quality of the bowel preparation                         was fair. Findings:      The perianal exam findings include round non tender, non erythematous       lesion next to external hemorrhoid.      The perianal exam findings include non-thrombosed external hemorrhoids.      A 6 mm polyp was found in the cecum. The polyp was sessile. The polyp       was removed with a cold snare. Resection and retrieval were complete.      A 15 mm polyp was found in the transverse colon. The polyp was flat.       Preparations were made for mucosal resection. NBI was done to mark the       borders of the lesion. Saline was injected to raise the lesion. Snare       mucosal resection was performed. Resection and retrieval were complete.       Piecemeal resection was done using hot and cold snare      A 4 mm polyp was found in the sigmoid colon. The polyp was sessile. The       polyp was removed with a cold biopsy forceps. Resection and retrieval       were complete.      A few diverticula were found in the sigmoid colon.      The exam was otherwise without abnormality.      The rectum, sigmoid colon, descending colon, transverse colon, ascending       colon and cecum appeared normal. Biopsies for histology were taken with       a cold forceps from the entire colon for evaluation of microscopic       colitis.      Non-bleeding internal hemorrhoids were found during retroflexion. Impression:            - Preparation of the colon was fair.                        -  Non-thrombosed external hemorrhoids found on                         perianal exam.                        -  One 6 mm polyp in the cecum, removed with a cold                         snare. Resected and retrieved.                        - One 15 mm polyp in the transverse colon, removed                         with mucosal resection. Resected and retrieved.                        - One 4 mm polyp in the sigmoid colon, removed with a                         cold biopsy forceps. Resected and retrieved.                        - Diverticulosis in the sigmoid colon.                        - The examination was otherwise normal.                        - The rectum, sigmoid colon, descending colon,                         transverse colon, ascending colon and cecum are                         normal. Biopsied.                        - Non-bleeding internal hemorrhoids.                        - Mucosal resection was performed. Resection and                         retrieval were complete. Recommendation:        - Refer to [place] [facility] for lesion noted on                         peri-anal exam above                        - Discharge patient to home (with escort).                        - Advance diet as tolerated.                        - Continue present medications.                        -  Await pathology results.                        - Repeat colonoscopy in 1 year, with 2 day prep.                        - The findings and recommendations were discussed with                         the patient.                        - The findings and recommendations were discussed with                         the patient's family.                        - Return to primary care physician as previously                         scheduled.                        - High fiber diet. Procedure Code(s):     --- Professional ---                        [phone], Colonoscopy, flexible; with endoscopic mucosal                         resection                        45385, 67, Colonoscopy, flexible; with removal of                          tumor(s), polyp(s), or other lesion(s) by snare                         technique                        45380, 64, Colonoscopy, flexible; with biopsy, single                         or multiple Diagnosis Code(s):     --- Professional ---                        Z12.11, Encounter for screening for malignant neoplasm                         of colon                        K63.5, Polyp of colon CPT copyright 2019 American Medical Association. All rights reserved. The codes documented in this report are preliminary and upon coder review may  be revised to meet current compliance requirements.  Vonda Antigua, MD Margretta Sidle B. Bonna Gains MD, MD 11/25/2020 1:02:12 PM This report has been signed electronically. Number of Addenda: 0 Note Initiated On: 11/25/2020 11:59 AM Scope Withdrawal Time: 0 hours 24 minutes 29 seconds  Total Procedure Duration: 0 hours 39 minutes 28 seconds  Estimated Blood Loss:  Estimated blood loss: none.      [facility]

## 2020-11-26 ENCOUNTER — Telehealth: Payer: Self-pay

## 2020-11-26 ENCOUNTER — Encounter: Payer: Self-pay | Admitting: Gastroenterology

## 2020-11-26 LAB — SURGICAL PATHOLOGY

## 2020-11-26 NOTE — Telephone Encounter (Signed)
Referral was faxed to [facility] [place] [facility] and they will contact the patient to schedule.

## 2020-12-02 NOTE — Anesthesia Postprocedure Evaluation (Signed)
Anesthesia Post Note  Patient: Marissa Gutierrez  Procedure(s) Performed: COLONOSCOPY WITH PROPOFOL (N/A )  Patient location during evaluation: Endoscopy Anesthesia Type: General Level of consciousness: awake and alert Pain management: pain level controlled Vital Signs Assessment: post-procedure vital signs reviewed and stable Respiratory status: spontaneous breathing, nonlabored ventilation and respiratory function stable Cardiovascular status: blood pressure returned to baseline and stable Postop Assessment: no apparent nausea or vomiting Anesthetic complications: no   No complications documented.   Last Vitals:  Vitals:   11/25/20 1259 11/25/20 1309  BP: 95/75 109/75  Pulse:    Resp:    Temp: (!) 36.2 C   SpO2:      Last Pain:  Vitals:   11/26/20 0755  TempSrc:   PainSc: 0-No pain                 Alphonsus Sias

## 2020-12-08 ENCOUNTER — Encounter: Payer: Self-pay | Admitting: Gastroenterology

## 2020-12-28 DIAGNOSIS — K620 Anal polyp: Secondary | ICD-10-CM | POA: Diagnosis not present

## 2020-12-28 DIAGNOSIS — K430 Incisional hernia with obstruction, without gangrene: Secondary | ICD-10-CM | POA: Diagnosis not present

## 2020-12-28 DIAGNOSIS — K642 Third degree hemorrhoids: Secondary | ICD-10-CM | POA: Diagnosis not present

## 2020-12-28 DIAGNOSIS — R197 Diarrhea, unspecified: Secondary | ICD-10-CM | POA: Diagnosis not present

## 2021-01-20 ENCOUNTER — Ambulatory Visit: Payer: BC Managed Care – PPO | Admitting: Gastroenterology

## 2021-01-20 ENCOUNTER — Encounter: Payer: Self-pay | Admitting: Gastroenterology

## 2021-01-20 ENCOUNTER — Other Ambulatory Visit: Payer: Self-pay

## 2021-01-20 VITALS — BP 133/81 | HR 71 | Temp 97.5°F | Ht 67.0 in | Wt 311.4 lb

## 2021-01-20 DIAGNOSIS — R197 Diarrhea, unspecified: Secondary | ICD-10-CM | POA: Diagnosis not present

## 2021-01-20 MED ORDER — COLESTIPOL HCL 1 G PO TABS: 2.0000 g | ORAL_TABLET | Freq: Every day | ORAL | 0 refills | Status: DC

## 2021-01-20 NOTE — Progress Notes (Signed)
Marissa Antigua, MD [address]  [place]  [place], [place] [address]  Main: [phone]  Fax: [phone]   Primary Care Physician: Marissa Pion, FNP   Chief complaint: Diarrhea  HPI: Marissa Gutierrez is a 55 y.o. female here for follow-up of diarrhea.  Patient has been taking Imodium, about 6 pills a day, and continues to have diarrhea.  With this however, she is not having any fecal incontinence.  No blood in stool.  No weight loss.  No nausea or vomiting.  Biopsies for microscopic colitis were negative.  Patient was referred to [place] [facility] for evaluation of periumbilical fat and vessel containing hernia, and evaluation of external hemorrhoid with presence of a nonerythematous lesion next to it.  She states she was evaluated by Dr. Johney Gutierrez and after discussion with him, they have elected to not undergo surgery of both the hernia and perianal lesion.  She states she underwent a thorough rectal exam by Dr. Johney Gutierrez in his clinic.  Their clinic notes are not available to [place] yet.  Current Outpatient Medications  Medication Sig Dispense Refill  . colestipol (COLESTID) 1 g tablet Take 2 tablets (2 g total) by mouth daily. 180 tablet 0  . loperamide (IMODIUM) 1 MG/5ML solution Take 1 mg by mouth as needed for diarrhea or loose stools.     No current facility-administered medications for this visit.    Allergies as of 01/20/2021 - Review Complete 01/20/2021  Allergen Reaction Noted  . Naproxen sodium Shortness Of Breath 09/15/2014    ROS:  General: Negative for anorexia, weight loss, fever, chills, fatigue, weakness. ENT: Negative for hoarseness, difficulty swallowing , nasal congestion. CV: Negative for chest pain, angina, palpitations, dyspnea on exertion, peripheral edema.  Respiratory: Negative for dyspnea at rest, dyspnea on exertion, cough, sputum, wheezing.  GI: See history of present illness. GU:  Negative for dysuria, hematuria, urinary incontinence, urinary  frequency, nocturnal urination.  Endo: Negative for unusual weight change.    Physical Examination:   BP 133/81   Pulse 71   Temp (!) 97.5 F (36.4 C) (Oral)   Ht 5\' 7"  (1.702 m)   Wt (!) 311 lb 6.4 oz (141.3 kg)   BMI 48.77 kg/m   General: Well-nourished, well-developed in no acute distress.  Eyes: No icterus. Conjunctivae pink. Mouth: Oropharyngeal mucosa moist and pink , no lesions erythema or exudate. Neck: Supple, Trachea midline Abdomen: Bowel sounds are normal, nontender, nondistended, no hepatosplenomegaly or masses, no abdominal bruits or hernia , no rebound or guarding.   Extremities: No lower extremity edema. No clubbing or deformities. Neuro: Alert and oriented x 3.  Grossly intact. Skin: Warm and dry, no jaundice.   Psych: Alert and cooperative, normal mood and affect.   Labs: CMP     Component Value Date/Time   NA 135 01/26/2015 0517   K 3.8 01/26/2015 0517   CL 102 01/26/2015 0517   CO2 28 01/26/2015 0517   GLUCOSE 132 (H) 01/26/2015 0517   BUN 11 01/26/2015 0517   CREATININE 0.81 01/26/2015 0517   CALCIUM 8.7 (L) 01/26/2015 0517   PROT 6.7 11/17/2020 1515   PROT 7.5 01/26/2015 0517   ALBUMIN 3.7 (L) 11/17/2020 1515   ALBUMIN 3.8 01/26/2015 0517   AST 28 11/17/2020 1515   AST 19 01/26/2015 0517   ALT 27 11/17/2020 1515   ALT 16 01/26/2015 0517   ALKPHOS 86 11/17/2020 1515   ALKPHOS 57 01/26/2015 0517   BILITOT 0.4 11/17/2020 1515  BILITOT 0.6 01/26/2015 0517   GFRNONAA >60 01/26/2015 0517   GFRAA >60 01/26/2015 0517   Lab Results  Component Value Date   WBC 12.1 (H) 01/26/2015   HGB 15.4 01/26/2015   HCT 45.1 01/26/2015   MCV 97 01/26/2015   PLT 192 01/26/2015    Imaging Studies: No results found.  Assessment and Plan:   Marissa Gutierrez is a 55 y.o. y/o female here for follow-up of diarrhea female here for follow-up of diarrhea  Patient continues to have diarrhea despite Imodium Start Colestid given her history of cholecystectomy, and increase dose as needed  Patient to  use Imodium only as needed instead of daily that she  Patient advised to avoid foods or beverages with sugar sweeteners as well  Awaiting records from Dr. Clyda Gutierrez clinic  Dr Marissa Gutierrez

## 2021-01-25 ENCOUNTER — Telehealth: Payer: Self-pay

## 2021-01-25 NOTE — Telephone Encounter (Signed)
[facility] and requested patient's medical records.

## 2021-01-25 NOTE — Telephone Encounter (Signed)
-----   Message from Virgel Manifold, MD sent at 01/20/2021  2:23 PM EDT ----- We need to request clinic notes from [place] [facility].  We had sent the referral, so this should not need record release.  She was seen by them but we do not have their notes.

## 2021-02-24 ENCOUNTER — Other Ambulatory Visit: Payer: Self-pay | Admitting: Gastroenterology

## 2021-02-24 MED ORDER — COLESTIPOL HCL 1 G PO TABS: 2.0000 g | ORAL_TABLET | Freq: Two times a day (BID) | ORAL | 2 refills | Status: DC

## 2021-03-03 DIAGNOSIS — S161XXA Strain of muscle, fascia and tendon at neck level, initial encounter: Secondary | ICD-10-CM | POA: Diagnosis not present

## 2021-03-07 ENCOUNTER — Ambulatory Visit
Admission: RE | Admit: 2021-03-07 | Discharge: 2021-03-07 | Disposition: A | Discharge status: Home / Self Care | Payer: BC Managed Care – PPO | Source: Ambulatory Visit | Attending: Chiropractor | Admitting: Chiropractor

## 2021-03-07 ENCOUNTER — Other Ambulatory Visit: Payer: Self-pay | Admitting: Chiropractor

## 2021-03-07 ENCOUNTER — Other Ambulatory Visit: Payer: Self-pay

## 2021-03-07 ENCOUNTER — Ambulatory Visit
Admission: RE | Admit: 2021-03-07 | Discharge: 2021-03-07 | Disposition: A | Discharge status: Home / Self Care | Payer: BC Managed Care – PPO | Attending: Chiropractor | Admitting: Chiropractor

## 2021-03-07 DIAGNOSIS — S139XXA Sprain of joints and ligaments of unspecified parts of neck, initial encounter: Secondary | ICD-10-CM

## 2021-03-07 DIAGNOSIS — M546 Pain in thoracic spine: Secondary | ICD-10-CM | POA: Diagnosis not present

## 2021-03-07 DIAGNOSIS — S233XXA Sprain of ligaments of thoracic spine, initial encounter: Secondary | ICD-10-CM | POA: Insufficient documentation

## 2021-03-07 DIAGNOSIS — M542 Cervicalgia: Secondary | ICD-10-CM | POA: Diagnosis not present

## 2021-04-17 ENCOUNTER — Other Ambulatory Visit: Payer: Self-pay | Admitting: Gastroenterology

## 2021-04-27 ENCOUNTER — Ambulatory Visit: Payer: BC Managed Care – PPO | Admitting: Gastroenterology

## 2021-05-03 ENCOUNTER — Other Ambulatory Visit: Payer: Self-pay | Admitting: Gastroenterology

## 2021-05-03 NOTE — Telephone Encounter (Signed)
Pt is aware Rx refilled to last until appt

## 2021-05-26 ENCOUNTER — Other Ambulatory Visit: Payer: Self-pay | Admitting: Gastroenterology

## 2021-06-09 DIAGNOSIS — H53462 Homonymous bilateral field defects, left side: Secondary | ICD-10-CM | POA: Diagnosis not present

## 2021-06-09 DIAGNOSIS — H53461 Homonymous bilateral field defects, right side: Secondary | ICD-10-CM | POA: Diagnosis not present

## 2021-06-10 DIAGNOSIS — Z20822 Contact with and (suspected) exposure to covid-19: Secondary | ICD-10-CM | POA: Diagnosis not present

## 2021-06-10 DIAGNOSIS — R4701 Aphasia: Secondary | ICD-10-CM | POA: Diagnosis not present

## 2021-06-10 DIAGNOSIS — I10 Essential (primary) hypertension: Secondary | ICD-10-CM | POA: Diagnosis not present

## 2021-06-10 DIAGNOSIS — H538 Other visual disturbances: Secondary | ICD-10-CM | POA: Diagnosis not present

## 2021-06-10 DIAGNOSIS — E119 Type 2 diabetes mellitus without complications: Secondary | ICD-10-CM | POA: Diagnosis not present

## 2021-06-10 DIAGNOSIS — R200 Anesthesia of skin: Secondary | ICD-10-CM | POA: Diagnosis not present

## 2021-06-10 DIAGNOSIS — H539 Unspecified visual disturbance: Secondary | ICD-10-CM | POA: Diagnosis not present

## 2021-06-10 DIAGNOSIS — R479 Unspecified speech disturbances: Secondary | ICD-10-CM | POA: Diagnosis not present

## 2021-06-10 DIAGNOSIS — R410 Disorientation, unspecified: Secondary | ICD-10-CM | POA: Diagnosis not present

## 2021-06-10 DIAGNOSIS — Z886 Allergy status to analgesic agent status: Secondary | ICD-10-CM | POA: Diagnosis not present

## 2021-06-10 DIAGNOSIS — R4789 Other speech disturbances: Secondary | ICD-10-CM | POA: Diagnosis not present

## 2021-06-10 DIAGNOSIS — E669 Obesity, unspecified: Secondary | ICD-10-CM | POA: Diagnosis not present

## 2021-06-10 DIAGNOSIS — F17210 Nicotine dependence, cigarettes, uncomplicated: Secondary | ICD-10-CM | POA: Diagnosis not present

## 2021-06-10 DIAGNOSIS — Z7951 Long term (current) use of inhaled steroids: Secondary | ICD-10-CM | POA: Diagnosis not present

## 2021-06-11 DIAGNOSIS — Z86718 Personal history of other venous thrombosis and embolism: Secondary | ICD-10-CM | POA: Diagnosis not present

## 2021-06-11 DIAGNOSIS — I10 Essential (primary) hypertension: Secondary | ICD-10-CM | POA: Diagnosis not present

## 2021-06-11 DIAGNOSIS — H5340 Unspecified visual field defects: Secondary | ICD-10-CM | POA: Diagnosis not present

## 2021-06-11 DIAGNOSIS — R410 Disorientation, unspecified: Secondary | ICD-10-CM | POA: Diagnosis not present

## 2021-06-11 DIAGNOSIS — F17200 Nicotine dependence, unspecified, uncomplicated: Secondary | ICD-10-CM | POA: Diagnosis not present

## 2021-06-11 DIAGNOSIS — I639 Cerebral infarction, unspecified: Secondary | ICD-10-CM | POA: Diagnosis not present

## 2021-06-11 DIAGNOSIS — R4789 Other speech disturbances: Secondary | ICD-10-CM | POA: Diagnosis not present

## 2021-06-11 DIAGNOSIS — R519 Headache, unspecified: Secondary | ICD-10-CM | POA: Diagnosis not present

## 2021-06-11 DIAGNOSIS — H547 Unspecified visual loss: Secondary | ICD-10-CM | POA: Diagnosis not present

## 2021-06-11 DIAGNOSIS — G459 Transient cerebral ischemic attack, unspecified: Secondary | ICD-10-CM | POA: Diagnosis not present

## 2021-06-11 DIAGNOSIS — H538 Other visual disturbances: Secondary | ICD-10-CM | POA: Diagnosis not present

## 2021-06-11 DIAGNOSIS — Z886 Allergy status to analgesic agent status: Secondary | ICD-10-CM | POA: Diagnosis not present

## 2021-06-11 DIAGNOSIS — I6522 Occlusion and stenosis of left carotid artery: Secondary | ICD-10-CM | POA: Diagnosis not present

## 2021-06-12 DIAGNOSIS — I6522 Occlusion and stenosis of left carotid artery: Secondary | ICD-10-CM | POA: Diagnosis not present

## 2021-06-12 DIAGNOSIS — R519 Headache, unspecified: Secondary | ICD-10-CM | POA: Diagnosis not present

## 2021-06-12 DIAGNOSIS — R4789 Other speech disturbances: Secondary | ICD-10-CM | POA: Diagnosis not present

## 2021-06-12 DIAGNOSIS — R410 Disorientation, unspecified: Secondary | ICD-10-CM | POA: Diagnosis not present

## 2021-06-12 DIAGNOSIS — H538 Other visual disturbances: Secondary | ICD-10-CM | POA: Diagnosis not present

## 2021-06-13 ENCOUNTER — Ambulatory Visit (INDEPENDENT_AMBULATORY_CARE_PROVIDER_SITE_OTHER): Payer: BC Managed Care – PPO | Admitting: Gastroenterology

## 2021-06-13 ENCOUNTER — Other Ambulatory Visit: Payer: Self-pay

## 2021-06-13 DIAGNOSIS — R197 Diarrhea, unspecified: Secondary | ICD-10-CM

## 2021-06-13 MED ORDER — COLESTIPOL HCL 1 G PO TABS
2.0000 g | ORAL_TABLET | Freq: Two times a day (BID) | ORAL | 1 refills | Status: DC
Start: 2021-06-13 — End: 2022-02-07

## 2021-06-13 NOTE — Progress Notes (Signed)
Vonda Antigua, MD [address]  [place]  [place], [place] [address]  Main: [phone]  Fax: [phone]   Primary Care Physician: Bunnie Pion, FNP  Chief complaint: Diarrhea   HPI: Marissa Gutierrez is a 55 y.o. female here for follow-up of diarrhea.  Patient reports having 1-2 formed bowel movements a day since starting Colestid.  Is not taking any more Imodium.  Is very satisfied with the improvement in her symptoms.  Denies any further diarrhea.  No abdominal pain, weight loss, nausea or vomiting.  Reports good appetite.   ROS: All ROS reviewed and negative except as per HPI   Past Medical History:  Diagnosis Date   GERD (gastroesophageal reflux disease)     Past Surgical History:  Procedure Laterality Date   CHOLECYSTECTOMY     COLONOSCOPY WITH PROPOFOL N/A 11/25/2020   Procedure: COLONOSCOPY WITH PROPOFOL;  Surgeon: Virgel Manifold, MD;  Location: [facility] ENDOSCOPY;  Service: Endoscopy;  Laterality: N/A;  PENDING C-19 TEST RESULT FROM 11/25/2020   DILATION AND CURETTAGE OF UTERUS  1989   TOOTH EXTRACTION     WISDOM TOOTH EXTRACTION      Prior to Admission medications   Medication Sig Start Date End Date Taking? Authorizing Provider  atorvastatin (LIPITOR) 80 MG tablet Take 80 mg by mouth daily. 06/12/21  Yes [provider]  [facility] ASPIRIN ADULT LOW DOSE 81 MG chewable tablet Chew 81 mg by mouth daily. 06/12/21  Yes [provider]  loperamide (IMODIUM) 1 MG/5ML solution Take 1 mg by mouth as needed for diarrhea or loose stools.   Yes [provider]  colestipol (COLESTID) 1 g tablet Take 2 tablets (2 g total) by mouth 2 (two) times daily. 06/13/21 12/10/21  Virgel Manifold, MD    Family History  Problem Relation Age of Onset   Arthritis Mother    Lung cancer Father    Heart disease Maternal Grandfather      Social History   Tobacco Use   Smoking status: Every Day   Smokeless tobacco: Never  Vaping Use   Vaping Use:  Never used  Substance Use Topics   Alcohol use: Not Currently   Drug use: No    Allergies as of 06/13/2021 - Review Complete 01/20/2021  Allergen Reaction Noted   Naproxen sodium Shortness Of Breath 09/15/2014    Physical Examination:  Constitutional: General:   Alert,  Well-developed, well-nourished, pleasant and cooperative in NAD There were no vitals taken for this visit.  Respiratory: Normal respiratory effort  Gastrointestinal:  Soft, non-tender and non-distended without masses, hepatosplenomegaly or hernias noted.  No guarding or rebound tenderness.     Cardiac: No clubbing or edema.  No cyanosis. Normal posterior tibial pedal pulses noted.  Psych:  Alert and cooperative. Normal mood and affect.  Musculoskeletal:  Normal gait. Head normocephalic, atraumatic. Symmetrical without gross deformities. 5/5 Lower extremity strength bilaterally.  Skin: Warm. Intact without significant lesions or rashes. No jaundice.  Neck: Supple, trachea midline  Lymph: No cervical lymphadenopathy  Psych:  Alert and oriented x3, Alert and cooperative. Normal mood and affect.  Labs:  06/11/2021 labs, hemoglobin 16.1, platelets 213  AST 27, ALT 22, alk phos 93, total bilirubin 0.6  Imaging Studies:   Assessment and Plan:   Marissa Gutierrez is a 55 y.o. y/o female here for follow-up of diarrhea  Diarrhea has resolved with Colestid Patient is requesting refill, sent to pharmacy  Continue current dosing If symptoms change, patient  advised to call us back  Clinic notes by [place] [facility], Dr. Johney Maine reviewed and they had discussed hernia surgery with her, including anoscopy to evaluate prolapsing anal polyp.  Continue follow-up with Dr. Johney Maine in this regard.  Patient was advised that her repeat colonoscopy is due in February 2023 and she verbalized understanding.  Dr Vonda Antigua

## 2021-06-15 DIAGNOSIS — I639 Cerebral infarction, unspecified: Secondary | ICD-10-CM | POA: Diagnosis not present

## 2021-07-07 DIAGNOSIS — I639 Cerebral infarction, unspecified: Secondary | ICD-10-CM | POA: Diagnosis not present
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
## 2021-07-19 DIAGNOSIS — Z6841 Body Mass Index (BMI) 40.0 and over, adult: Secondary | ICD-10-CM | POA: Diagnosis not present

## 2021-07-19 DIAGNOSIS — I639 Cerebral infarction, unspecified: Secondary | ICD-10-CM | POA: Diagnosis not present

## 2021-08-11 DIAGNOSIS — I639 Cerebral infarction, unspecified: Secondary | ICD-10-CM | POA: Diagnosis not present

## 2021-08-14 DIAGNOSIS — I639 Cerebral infarction, unspecified: Secondary | ICD-10-CM | POA: Diagnosis not present

## 2022-02-07 ENCOUNTER — Other Ambulatory Visit: Payer: Self-pay

## 2022-02-07 MED ORDER — COLESTIPOL HCL 1 G PO TABS: 2.0000 g | ORAL_TABLET | Freq: Two times a day (BID) | ORAL | 0 refills | Status: DC

## 2022-02-07 NOTE — Telephone Encounter (Signed)
Last office visit 06/13/21 Diarrhea  ?Last refill 06/13/21 1 refills  ?Has no appointment is schedule  ?

## 2022-05-14 ENCOUNTER — Other Ambulatory Visit: Payer: Self-pay | Admitting: Gastroenterology

## 2023-02-15 ENCOUNTER — Encounter: Payer: Self-pay | Admitting: Chiropractor

## 2023-02-15 ENCOUNTER — Ambulatory Visit
Admission: RE | Admit: 2023-02-15 | Discharge: 2023-02-15 | Disposition: A | Discharge status: Home / Self Care | Payer: BC Managed Care – PPO | Source: Ambulatory Visit | Attending: Chiropractor | Admitting: Chiropractor

## 2023-02-15 ENCOUNTER — Other Ambulatory Visit: Payer: Self-pay | Admitting: Chiropractor

## 2023-02-15 DIAGNOSIS — G540 Brachial plexus disorders: Secondary | ICD-10-CM | POA: Insufficient documentation

## 2023-02-15 DIAGNOSIS — S46012A Strain of muscle(s) and tendon(s) of the rotator cuff of left shoulder, initial encounter: Secondary | ICD-10-CM

## 2023-03-02 ENCOUNTER — Other Ambulatory Visit: Payer: Self-pay | Admitting: Family Medicine

## 2023-03-02 DIAGNOSIS — Z1231 Encounter for screening mammogram for malignant neoplasm of breast: Secondary | ICD-10-CM

## 2023-03-09 ENCOUNTER — Other Ambulatory Visit: Payer: Self-pay

## 2023-03-09 DIAGNOSIS — Z122 Encounter for screening for malignant neoplasm of respiratory organs: Secondary | ICD-10-CM

## 2023-03-09 DIAGNOSIS — F17210 Nicotine dependence, cigarettes, uncomplicated: Secondary | ICD-10-CM

## 2023-03-09 DIAGNOSIS — Z87891 Personal history of nicotine dependence: Secondary | ICD-10-CM

## 2023-04-17 ENCOUNTER — Encounter: Payer: BC Managed Care – PPO | Admitting: Acute Care

## 2023-04-18 ENCOUNTER — Ambulatory Visit: Payer: BC Managed Care – PPO

## 2023-05-08 ENCOUNTER — Encounter: Payer: Self-pay | Admitting: Acute Care

## 2023-05-08 ENCOUNTER — Other Ambulatory Visit: Payer: Self-pay | Admitting: Family Medicine

## 2023-05-08 ENCOUNTER — Ambulatory Visit (INDEPENDENT_AMBULATORY_CARE_PROVIDER_SITE_OTHER): Payer: BC Managed Care – PPO | Admitting: Acute Care

## 2023-05-08 DIAGNOSIS — R600 Localized edema: Secondary | ICD-10-CM

## 2023-05-08 DIAGNOSIS — F17210 Nicotine dependence, cigarettes, uncomplicated: Secondary | ICD-10-CM | POA: Diagnosis not present

## 2023-05-08 NOTE — Progress Notes (Signed)
Virtual Visit via Telephone Note  I connected with Marissa Gutierrez on 05/08/23 at  2:30 PM EDT by telephone and verified that I am speaking with the correct person using two identifiers.  Location: Patient:  At home Provider:  [address] [place], [place], [address]    I discussed the limitations, risks, security and privacy concerns of performing an evaluation and management service by telephone and the availability of in person appointments. I also discussed with the patient that there may be a patient responsible charge related to this service. The patient expressed understanding and agreed to proceed.   Shared Decision Making Visit Lung Cancer Screening Program [phone])   Eligibility: Age 57 y.o. Pack Years Smoking History Calculation 38 pack year smoking history (# packs/per year x # years smoked) Recent History of coughing up blood  no Unexplained weight loss? no ( >Than 15 pounds within the last 6 months ) Prior History Lung / other cancer no (Diagnosis within the last 5 years already requiring surveillance chest CT Scans). Smoking Status Current Smoker Former Smokers: Years since quit:  NA  Quit Date:  NA  Visit Components: Discussion included one or more decision making aids. yes Discussion included risk/benefits of screening. yes Discussion included potential follow up diagnostic testing for abnormal scans. yes Discussion included meaning and risk of over diagnosis. yes Discussion included meaning and risk of False Positives. yes Discussion included meaning of total radiation exposure. yes  Counseling Included: Importance of adherence to annual lung cancer LDCT screening. yes Impact of comorbidities on ability to participate in the program. yes Ability and willingness to under diagnostic treatment. yes  Smoking Cessation Counseling: Current Smokers:  Discussed importance of smoking cessation. yes Information about tobacco cessation classes and interventions  provided to patient. yes Patient provided with "ticket" for LDCT Scan. yes Symptomatic Patient. no  Counseling NA Diagnosis Code: Tobacco Use Z72.0 Asymptomatic Patient yes  Counseling (Intermediate counseling: > three minutes counseling) Q2595 Former Smokers:  Discussed the importance of maintaining cigarette abstinence. yes Diagnosis Code: Personal History of Nicotine Dependence. G38.756 Information about tobacco cessation classes and interventions provided to patient. Yes Patient provided with "ticket" for LDCT Scan. yes Written Order for Lung Cancer Screening with LDCT placed in Epic. Yes (CT Chest Lung Cancer Screening Low Dose W/O CM) [id number] Z12.2-Screening of respiratory organs Z87.891-Personal history of nicotine dependence  I have spent 25 minutes of face to face/ virtual visit   time with  Ms. Marissa Gutierrez discussing the risks and benefits of lung cancer screening. We viewed / discussed a power point together that explained in detail the above noted topics. We paused at intervals to allow for questions to be asked and answered to ensure understanding.We discussed that the single most powerful action that she can take to decrease her risk of developing lung cancer is to quit smoking. We discussed whether or not she is ready to commit to setting a quit date. We discussed options for tools to aid in quitting smoking including nicotine replacement therapy, non-nicotine medications, support groups, Quit Smart classes, and behavior modification. We discussed that often times setting smaller, more achievable goals, such as eliminating 1 cigarette a day for a week and then 2 cigarettes a day for a week can be helpful in slowly decreasing the number of cigarettes smoked. This allows for a sense of accomplishment as well as providing a clinical benefit. I provided  her  with smoking cessation  information  with contact information for community resources,  classes, free nicotine replacement therapy, and  access to mobile apps, text messaging, and on-line smoking cessation help. I have also provided  her  the office contact information in the event she needs to contact me, or the screening staff. We discussed the time and location of the scan, and that either Abigail Miyamoto RN, Karlton Lemon, RN  or I will call / send a letter with the results within 24-72 hours of receiving them. The patient verbalized understanding of all of  the above and had no further questions upon leaving the office. They have my contact information in the event they have any further questions.  I spent 3 minutes counseling on smoking cessation and the health risks of continued tobacco abuse.  I explained to the patient that there has been a high incidence of coronary artery disease noted on these exams. I explained that this is a non-gated exam therefore degree or severity cannot be determined. This patient is on statin therapy. I have asked the patient to follow-up with their PCP regarding any incidental finding of coronary artery disease and management with diet or medication as their PCP  feels is clinically indicated. The patient verbalized understanding of the above and had no further questions upon completion of the visit.      Marissa Ngo, NP 05/08/2023

## 2023-05-08 NOTE — Patient Instructions (Signed)
Thank you for participating in the [facility] Scre[facility]. It was our pleasure to meet you today. We will call you with the results of your scan within the next few days. Your scan will be assigned a Lung RADS category score by the physicians reading the scans.  This Lung RADS score determines follow up scanning.  See below for description of categories, and follow up screening recommendations. We will be in touch to schedule your follow up screening annually or based on recommendations of our providers. We will fax a copy of your scan results to your Primary Care Physician, or the physician who referred you to the program, to ensure they have the results. Please call the office if you have any questions or concerns regarding your scanning experience or results.  Our office number is [phone]. Please speak with Abigail Miyamoto, RN. , or  Karlton Lemon RN, They are  our Lung Cancer Screening RN.'s If They are unavailable when you call, Please leave a message on the voice mail. We will return your call at our earliest convenience.This voice mail is monitored several times a day.  Remember, if your scan is normal, we will scan you annually as long as you continue to meet the criteria for the program. (Age 57-80, Current smoker or smoker who has quit within the last 15 years). If you are a smoker, remember, quitting is the single most powerful action that you can take to decrease your risk of lung cancer and other pulmonary, breathing related problems. We know quitting is hard, and we are here to help.  Please let us know if there is anything we can do to help you meet your goal of quitting. If you are a former smoker, Counselling psychologist. We are proud of you! Remain smoke free! Remember you can refer friends or family members through the number above.  We will screen them to make sure they meet criteria for the program. Thank you for helping [place] take better care of you by  participating in Lung Screening.   Lung RADS Categories:  Lung RADS 1: no nodules or definitely non-concerning nodules.  Recommendation is for a repeat annual scan in 12 months.  Lung RADS 2:  nodules that are non-concerning in appearance and behavior with a very low likelihood of becoming an active cancer. Recommendation is for a repeat annual scan in 12 months.  Lung RADS 3: nodules that are probably non-concerning , includes nodules with a low likelihood of becoming an active cancer.  Recommendation is for a 32-month repeat screening scan. Often noted after an upper respiratory illness. We will be in touch to make sure you have no questions, and to schedule your 31-month scan.  Lung RADS 4 A: nodules with concerning findings, recommendation is most often for a follow up scan in 3 months or additional testing based on our provider's assessment of the scan. We will be in touch to make sure you have no questions and to schedule the recommended 3 month follow up scan.  Lung RADS 4 B:  indicates findings that are concerning. We will be in touch with you to schedule additional diagnostic testing based on our provider's  assessment of the scan.  You can receive free nicotine replacement therapy ( patches, gum or mints) by calling 1-800-QUIT NOW. Please call so we can get you on the path to becoming  a non-smoker. I know it is hard, but you can do this!  Other options for assistance in smoking  cessation ( As covered by your insurance benefits)  Hypnosis for smoking cessation  [employer] [phone]  Acupuncture for smoking cessation  [employer] [phone]

## 2023-05-09 ENCOUNTER — Ambulatory Visit: Admission: RE | Admit: 2023-05-09 | Payer: BC Managed Care – PPO | Source: Ambulatory Visit

## 2023-05-09 ENCOUNTER — Ambulatory Visit
Admission: RE | Admit: 2023-05-09 | Discharge: 2023-05-09 | Disposition: A | Discharge status: Home / Self Care | Payer: BC Managed Care – PPO | Source: Ambulatory Visit | Attending: Acute Care | Admitting: Acute Care

## 2023-05-09 DIAGNOSIS — R600 Localized edema: Secondary | ICD-10-CM

## 2023-05-09 DIAGNOSIS — Z122 Encounter for screening for malignant neoplasm of respiratory organs: Secondary | ICD-10-CM | POA: Insufficient documentation

## 2023-05-09 DIAGNOSIS — Z87891 Personal history of nicotine dependence: Secondary | ICD-10-CM | POA: Insufficient documentation

## 2023-05-09 DIAGNOSIS — F17210 Nicotine dependence, cigarettes, uncomplicated: Secondary | ICD-10-CM | POA: Insufficient documentation

## 2023-05-14 ENCOUNTER — Telehealth: Payer: Self-pay | Admitting: Acute Care

## 2023-05-15 NOTE — Telephone Encounter (Signed)
Spoke with Tiffany at [facility].  Call report:  IMPRESSION: 1. 11.1 mm apical right upper lobe nodule. Lung-RADS 4A, suspicious. Follow up low-dose chest CT without contrast in 3 months (please use the following order, "CT CHEST LCS NODULE FOLLOW-UP W/O CM") is recommended. Alternatively, PET may be considered when there is a solid component 8mm or larger. These results will be called to the ordering clinician or representative by the Radiologist Assistant, and communication documented in the PACS or [employer]. 2. Age advanced left anterior descending coronary artery calcification. 3. Cirrhosis. 4.  Emphysema (ICD10-J43.9).

## 2023-05-17 ENCOUNTER — Other Ambulatory Visit: Payer: Self-pay | Admitting: Acute Care

## 2023-05-17 ENCOUNTER — Telehealth: Payer: Self-pay

## 2023-05-17 DIAGNOSIS — R911 Solitary pulmonary nodule: Secondary | ICD-10-CM

## 2023-05-17 NOTE — Telephone Encounter (Signed)
hey she's calling back can you put her on the sched. in the afternoon somewhere in Sept. she has nothing till Oct.

## 2023-05-17 NOTE — Telephone Encounter (Signed)
I spoke with the patient and scheduled her an appt for 8/29 at 3:30pm.  Nothing further needed.

## 2023-05-17 NOTE — Telephone Encounter (Signed)
Received epic secure chat from Kandice Robinsons, NP- pt needs f/u with Dr. Jayme Cloud after PET.  Lm to schedule appt.

## 2023-05-22 ENCOUNTER — Telehealth: Payer: Self-pay | Admitting: *Deleted

## 2023-05-22 ENCOUNTER — Telehealth: Payer: Self-pay | Admitting: Gastroenterology

## 2023-05-22 ENCOUNTER — Other Ambulatory Visit: Payer: Self-pay | Admitting: *Deleted

## 2023-05-22 DIAGNOSIS — Z86010 Personal history of colonic polyps: Secondary | ICD-10-CM

## 2023-05-22 MED ORDER — NA SULFATE-K SULFATE-MG SULF 17.5-3.13-1.6 GM/177ML PO SOLN: 1.0000 | Freq: Once | ORAL | 0 refills | Status: AC

## 2023-05-22 NOTE — Telephone Encounter (Signed)
Colonoscopy schedule on 06/14/2023 with Dr Servando Snare at [facility]  Patient did not have a preference and wanted to complete the colonoscopy sooner

## 2023-05-22 NOTE — Telephone Encounter (Signed)
Gastroenterology Pre-Procedure Review  Request Date: 06/14/2023 Requesting Physician: Dr. Servando Snare  PATIENT REVIEW QUESTIONS: The patient responded to the following health history questions as indicated:    1. Are you having any GI issues? no 2. Do you have a personal history of Polyps? yes (11/25/2020) 3. Do you have a family history of Colon Cancer or Polyps? no 4. Diabetes Mellitus? no 5. Joint replacements in the past 12 months?no 6. Major health problems in the past 3 months?no 7. Any artificial heart valves, MVP, or defibrillator?no    MEDICATIONS & ALLERGIES:    Patient reports the following regarding taking any anticoagulation/antiplatelet therapy:   Plavix, Coumadin, Eliquis, Xarelto, Lovenox, Pradaxa, Brilinta, or Effient? no Aspirin? yes(81 mg)  Patient confirms/reports the following medications:  Current Outpatient Medications  Medication Sig Dispense Refill   Na Sulfate-K Sulfate-Mg Sulf 17.5-3.13-1.6 GM/177ML SOLN Take 1 kit by mouth once for 1 dose. 354 mL 0   atorvastatin (LIPITOR) 80 MG tablet Take 80 mg by mouth daily.     colestipol (COLESTID) 1 g tablet Take 2 tablets (2 g total) by mouth 2 (two) times daily. 360 tablet 0   [employer] ASPIRIN ADULT LOW DOSE 81 MG chewable tablet Chew 81 mg by mouth daily.     loperamide (IMODIUM) 1 MG/5ML solution Take 1 mg by mouth as needed for diarrhea or loose stools.     No current facility-administered medications for this visit.    Patient confirms/reports the following allergies:  Allergies  Allergen Reactions   Naproxen Sodium Shortness Of Breath    Orders Placed This Encounter  Procedures   Ambulatory referral to Gastroenterology    Referral Priority:   Routine    Referral Type:   Consultation    Referral Reason:   Specialty Services Required    Referred to Provider:   Midge Minium, MD    Number of Visits Requested:   1    AUTHORIZATION INFORMATION Primary Insurance: 1D#: Group #:  Secondary  Insurance: 1D#: Group #:  SCHEDULE INFORMATION: Date: 06/14/2023 Time: Location:  [facility]

## 2023-05-22 NOTE — Telephone Encounter (Signed)
Patient called in to schedule her colonoscopy. Per Dr. Maximino Greenland I recommend you have a repeat colonoscopy in 1 year, with 2 day prep, to determine if you have developed any new polyps and to screen for colorectal cancer.

## 2023-05-24 ENCOUNTER — Ambulatory Visit
Admission: RE | Admit: 2023-05-24 | Discharge: 2023-05-24 | Disposition: A | Discharge status: Home / Self Care | Payer: BC Managed Care – PPO | Source: Ambulatory Visit | Attending: Acute Care | Admitting: Acute Care

## 2023-05-24 DIAGNOSIS — K429 Umbilical hernia without obstruction or gangrene: Secondary | ICD-10-CM | POA: Insufficient documentation

## 2023-05-24 DIAGNOSIS — R911 Solitary pulmonary nodule: Secondary | ICD-10-CM | POA: Insufficient documentation

## 2023-05-24 LAB — GLUCOSE, CAPILLARY: Glucose-Capillary: 73 mg/dL (ref 70–99)

## 2023-05-24 MED ORDER — FLUDEOXYGLUCOSE F - 18 (FDG) INJECTION
12.0000 | Freq: Once | INTRAVENOUS | Status: AC | PRN
  Administered 2023-05-24: 12.19 via INTRAVENOUS

## 2023-05-31 ENCOUNTER — Encounter: Payer: Self-pay | Admitting: Pulmonary Disease

## 2023-05-31 ENCOUNTER — Telehealth: Payer: Self-pay | Admitting: *Deleted

## 2023-05-31 ENCOUNTER — Other Ambulatory Visit: Payer: Self-pay

## 2023-05-31 ENCOUNTER — Ambulatory Visit: Payer: BC Managed Care – PPO | Admitting: Pulmonary Disease

## 2023-05-31 VITALS — BP 120/84 | HR 71 | Temp 97.7°F | Ht 67.0 in | Wt 304.0 lb

## 2023-05-31 DIAGNOSIS — Z122 Encounter for screening for malignant neoplasm of respiratory organs: Secondary | ICD-10-CM

## 2023-05-31 DIAGNOSIS — F17210 Nicotine dependence, cigarettes, uncomplicated: Secondary | ICD-10-CM | POA: Diagnosis not present

## 2023-05-31 DIAGNOSIS — R911 Solitary pulmonary nodule: Secondary | ICD-10-CM | POA: Diagnosis not present

## 2023-05-31 DIAGNOSIS — Z87891 Personal history of nicotine dependence: Secondary | ICD-10-CM

## 2023-05-31 NOTE — Telephone Encounter (Signed)
Received procedure clearance from Dr Rush Farmer at [facility] clinic.  Patient was seen at [facility] clinic on 02/15/2022 for follow up of ischemic stroke sustained on or around 06/10/2021. I have not seen this patient since 02/15/2022. Based on my assessment within this time frame, she is cleared for a colonoscopy under general anesthesia.  Patient have been notified.

## 2023-05-31 NOTE — Progress Notes (Signed)
Subjective:    Patient ID: Marissa Gutierrez, female    DOB: [birth year], 57 y.o.   MRN: [id number]  Patient Care Team: Lorn Junes, FNP as PCP - General [facility])  Chief Complaint  Patient presents with   Consult    Lung Nodule. No SOB or wheezing. Dry cough.    HPI The patient is a 57 year old current smoker (1 PPD/40 PY) with history as noted below who presents for evaluation of a lung nodule incidentally noted on lung cancer screening CT.  The patient is kindly referred by Lorenda Cahill, FNP.  The patient underwent lung cancer screening CT on 09 May 2023 and this showed 12 mm apical right upper lobe nodule and smoking-related respiratory bronchiolitis.  Her scan was classified as a lung RADS 4A, suspicious.  This was followed by a PET/CT performed 24 May 2023 that shows no activity whatsoever on this nodule.  Other findings included coronary artery calcification, cirrhosis and emphysema.  With regards to these findings the patient is totally asymptomatic.  She does not endorse any shortness of breath, wheezing or dry cough.  She can perform activities of daily living without difficulty.  She has had weight management issues "all her life" but tries to stay active nonetheless.  She does not endorse any shortness of breath, wheezing or productive cough.  She occasionally has a dry cough which is usually short-lived.  No hemoptysis.  She is not on any inhalers.  Does not feel she needs any inhalers.   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  She does smoke 1 pack of cigarettes per day as noted above and has done so for 40 years.  Review of Systems A 10 point review of systems was performed and it is as noted above otherwise negative.   Past Medical History:  Diagnosis Date   GERD (gastroesophageal reflux disease)     Past Surgical History:  Procedure Laterality Date   CHOLECYSTECTOMY     COLONOSCOPY WITH PROPOFOL N/A 11/25/2020   Procedure: COLONOSCOPY WITH PROPOFOL;   Surgeon: Pasty Spillers, MD;  Location: [facility] ENDOSCOPY;  Service: Endoscopy;  Laterality: N/A;  PENDING C-19 TEST RESULT FROM 11/25/2020   DILATION AND CURETTAGE OF UTERUS  1989   TOOTH EXTRACTION     WISDOM TOOTH EXTRACTION      Patient Active Problem List   Diagnosis Date Noted   Screen for colon cancer    Polyp of transverse colon    Cecal polyp    Polyp of sigmoid colon    Morbid obesity (HCC) 11/02/2015   GERD (gastroesophageal reflux disease) 11/02/2015   Cough 11/02/2015   Tobacco abuse 11/02/2015    Family History  Problem Relation Age of Onset   Arthritis Mother    Lung cancer Father    Heart disease Maternal Grandfather     Social History   Tobacco Use   Smoking status: Every Day    Current packs/day: 1.00    Average packs/day: 1 pack/day for 40.0 years (40.0 ttl pk-yrs)    Types: Cigarettes   Smokeless tobacco: Never   Tobacco comments:    1PPD for 40 years. Khj 05/31/2023  Substance Use Topics   Alcohol use: Not Currently    Allergies  Allergen Reactions   Naproxen Sodium Shortness Of Breath    Current Meds  Medication Sig   colestipol (COLESTID) 1 g tablet Take 2 tablets (2 g total) by mouth 2 (two) times daily.   CVS ASPIRIN ADULT LOW  DOSE 81 MG chewable tablet Chew 81 mg by mouth daily.   There is no immunization history on file for this patient.     Objective:     BP 120/84 (BP Location: Right Wrist, Cuff Size: Normal)   Pulse 71   Temp 97.7 F (36.5 C)   Ht 5\' 7"  (1.702 m)   Wt (!) 304 lb (137.9 kg)   SpO2 96%   BMI 47.61 kg/m   SpO2: 96 % O2 Device: None (Room air)  GENERAL: Morbidly obese woman, no acute distress, fully ambulatory, no conversational dyspnea. HEAD: Normocephalic, atraumatic.  EYES: Pupils equal, round, reactive to light.  No scleral icterus.  MOUTH: Dentition intact, oral mucosa moist.  No thrush. NECK: Supple. No thyromegaly. Trachea midline. No JVD.  No adenopathy. PULMONARY: Good air entry bilaterally.   Coarse, otherwise, no adventitious sounds. CARDIOVASCULAR: S1 and S2. Regular rate and rhythm.  No rubs, murmurs or gallops heard. ABDOMEN: Obese otherwise benign. MUSCULOSKELETAL: No joint deformity, no clubbing, 1+ edema on left ankle, chronic.  Prominent varicose veins LE's. NEUROLOGIC: No overt focal deficit, no gait disturbance, speech is fluent. SKIN: Intact,warm,dry. PSYCH: Mood and behavior normal  Percentage of image from the CT performed 09 May 2023 showing a 12 mm nodule in the right upper lobe.  This nodule was noted to be non-FDG avid on follow-up PET/CT 24 May 2023, consistent with benign etiology:   Assessment & Plan:     ICD-10-CM   1. Lung nodule  R91.1    No uptake by PET/CT Resume lung cancer screening program Does have respiratory bronchiolitis (smokers bronchiolitis)    2. Tobacco dependence due to cigarettes  F17.210    Patient enrolled in lung cancer screening program Patient counseled regards to discontinuation of smoking Total counseling time 3 to 5 minutes     We discussed the implications of ongoing cigarette smoking particularly in view of evidence of smokers bronchiolitis on the patient's CT chest.  This entity responds to discontinuation of smoking.  Recommended that patient investigate the website [email or web address].  This is run by the [facility] and has good information on quitting smoking.  Patient is to follow-up with her primary care provider.  However, we will be happy to see her if any new difficulties or issues arise.   Gailen Shelter, MD Advanced Bronchoscopy PCCM [facility]    *This note was dictated using voice recognition software/Dragon.  Despite best efforts to proofread, errors can occur which can change the meaning. Any transcriptional errors that result from this process are unintentional and may not be fully corrected at the time of dictation.

## 2023-05-31 NOTE — Patient Instructions (Addendum)
These explore the website [email or web address].  We will return you to the regular lung cancer screening program.  Please come back as needed.

## 2023-06-13 ENCOUNTER — Encounter: Payer: Self-pay | Admitting: Gastroenterology

## 2023-06-14 ENCOUNTER — Other Ambulatory Visit: Payer: Self-pay

## 2023-06-14 ENCOUNTER — Ambulatory Visit: Payer: BC Managed Care – PPO | Admitting: Anesthesiology

## 2023-06-14 ENCOUNTER — Encounter: Payer: Self-pay | Admitting: Gastroenterology

## 2023-06-14 ENCOUNTER — Encounter
Admission: RE | Disposition: A | Discharge status: Home / Self Care | Payer: Self-pay | Source: Home / Self Care | Attending: Gastroenterology

## 2023-06-14 ENCOUNTER — Ambulatory Visit
Admission: RE | Admit: 2023-06-14 | Discharge: 2023-06-14 | Disposition: A | Discharge status: Home / Self Care | Payer: BC Managed Care – PPO | Attending: Gastroenterology | Admitting: Gastroenterology

## 2023-06-14 DIAGNOSIS — K635 Polyp of colon: Secondary | ICD-10-CM

## 2023-06-14 DIAGNOSIS — Z1211 Encounter for screening for malignant neoplasm of colon: Secondary | ICD-10-CM | POA: Insufficient documentation

## 2023-06-14 DIAGNOSIS — Z860100 Personal history of colon polyps, unspecified: Secondary | ICD-10-CM

## 2023-06-14 DIAGNOSIS — D124 Benign neoplasm of descending colon: Secondary | ICD-10-CM | POA: Diagnosis not present

## 2023-06-14 DIAGNOSIS — Z8673 Personal history of transient ischemic attack (TIA), and cerebral infarction without residual deficits: Secondary | ICD-10-CM | POA: Insufficient documentation

## 2023-06-14 DIAGNOSIS — D122 Benign neoplasm of ascending colon: Secondary | ICD-10-CM | POA: Insufficient documentation

## 2023-06-14 DIAGNOSIS — Z6841 Body Mass Index (BMI) 40.0 and over, adult: Secondary | ICD-10-CM | POA: Insufficient documentation

## 2023-06-14 DIAGNOSIS — K641 Second degree hemorrhoids: Secondary | ICD-10-CM | POA: Diagnosis not present

## 2023-06-14 DIAGNOSIS — D120 Benign neoplasm of cecum: Secondary | ICD-10-CM

## 2023-06-14 DIAGNOSIS — K5730 Diverticulosis of large intestine without perforation or abscess without bleeding: Secondary | ICD-10-CM | POA: Diagnosis not present

## 2023-06-14 DIAGNOSIS — K648 Other hemorrhoids: Secondary | ICD-10-CM

## 2023-06-14 DIAGNOSIS — F17210 Nicotine dependence, cigarettes, uncomplicated: Secondary | ICD-10-CM | POA: Insufficient documentation

## 2023-06-14 HISTORY — PX: COLONOSCOPY WITH PROPOFOL: SHX5780

## 2023-06-14 HISTORY — PX: POLYPECTOMY: SHX5525

## 2023-06-14 SURGERY — COLONOSCOPY WITH PROPOFOL
Anesthesia: General

## 2023-06-14 MED ORDER — PROPOFOL 500 MG/50ML IV EMUL
INTRAVENOUS | Status: DC | PRN
  Administered 2023-06-14: 100 ug/kg/min via INTRAVENOUS

## 2023-06-14 MED ORDER — DEXMEDETOMIDINE HCL IN NACL 80 MCG/20ML IV SOLN
INTRAVENOUS | Status: DC | PRN
  Administered 2023-06-14: 8 ug via INTRAVENOUS

## 2023-06-14 MED ORDER — EPHEDRINE SULFATE (PRESSORS) 50 MG/ML IJ SOLN
INTRAMUSCULAR | Status: DC | PRN
  Administered 2023-06-14: 5 mg via INTRAVENOUS
  Administered 2023-06-14 (×2): 10 mg via INTRAVENOUS

## 2023-06-14 MED ORDER — LIDOCAINE HCL (PF) 2 % IJ SOLN
INTRAMUSCULAR | Status: AC
  Filled 2023-06-14: qty 5

## 2023-06-14 MED ORDER — SODIUM CHLORIDE 0.9 % IV SOLN: INTRAVENOUS | Status: DC

## 2023-06-14 MED ORDER — PROPOFOL 10 MG/ML IV BOLUS
INTRAVENOUS | Status: DC | PRN
  Administered 2023-06-14: 50 mg via INTRAVENOUS
  Administered 2023-06-14: 30 mg via INTRAVENOUS
  Administered 2023-06-14 (×3): 40 mg via INTRAVENOUS

## 2023-06-14 MED ORDER — PROPOFOL 10 MG/ML IV BOLUS
INTRAVENOUS | Status: AC
  Filled 2023-06-14: qty 20

## 2023-06-14 MED ORDER — LIDOCAINE HCL (CARDIAC) PF 100 MG/5ML IV SOSY
PREFILLED_SYRINGE | INTRAVENOUS | Status: DC | PRN
  Administered 2023-06-14: 100 mg via INTRAVENOUS

## 2023-06-14 MED ORDER — EPHEDRINE 5 MG/ML INJ
INTRAVENOUS | Status: AC
  Filled 2023-06-14: qty 5

## 2023-06-14 NOTE — Anesthesia Preprocedure Evaluation (Addendum)
Anesthesia Evaluation  Patient identified by MRN, date of birth, ID band Patient awake    Reviewed: Allergy & Precautions, NPO status , Patient's Chart, lab work & pertinent test results  History of Anesthesia Complications Negative for: history of anesthetic complications  Airway Mallampati: III   Neck ROM: Full    Dental  (+) Missing   Pulmonary Current Smoker (1 ppd)Patient did not abstain from smoking.   Pulmonary exam normal breath sounds clear to auscultation       Cardiovascular Normal cardiovascular exam Rhythm:Regular Rate:Normal  Echo 07/07/21:    1. The left ventricle is normal in size with normal wall thickness.    2. The left ventricular systolic function is normal, LVEF is visually estimated at 55-60%.    3. The right ventricle is normal in size, with normal systolic function.     Neuro/Psych TIA (several years ago)   GI/Hepatic ,GERD  ,,  Endo/Other  Class 3 obesity  Renal/GU negative Renal ROS     Musculoskeletal   Abdominal   Peds  Hematology negative hematology ROS (+)   Anesthesia Other Findings   Reproductive/Obstetrics                             Anesthesia Physical Anesthesia Plan  ASA: 3  Anesthesia Plan: General   Post-op Pain Management:    Induction: Intravenous  PONV Risk Score and Plan: 2 and Propofol infusion, TIVA and Treatment may vary due to age or medical condition  Airway Management Planned: Natural Airway  Additional Equipment:   Intra-op Plan:   Post-operative Plan:   Informed Consent: I have reviewed the patients History and Physical, chart, labs and discussed the procedure including the risks, benefits and alternatives for the proposed anesthesia with the patient or authorized representative who has indicated his/her understanding and acceptance.       Plan Discussed with: CRNA  Anesthesia Plan Comments: (LMA/GETA backup  discussed.  Patient consented for risks of anesthesia including but not limited to:  - adverse reactions to medications - damage to eyes, teeth, lips or other oral mucosa - nerve damage due to positioning  - sore throat or hoarseness - damage to heart, brain, nerves, lungs, other parts of body or loss of life  Informed patient about role of CRNA in peri- and intra-operative care.  Patient voiced understanding.)        Anesthesia Quick Evaluation

## 2023-06-14 NOTE — Transfer of Care (Signed)
Immediate Anesthesia Transfer of Care Note  Patient: Marissa Gutierrez  Procedure(s) Performed: COLONOSCOPY WITH PROPOFOL POLYPECTOMY  Patient Location: Endoscopy Unit  Anesthesia Type:General  Level of Consciousness: drowsy and patient cooperative  Airway & Oxygen Therapy: Patient Spontanous Breathing and Patient connected to face mask oxygen  Post-op Assessment: Report given to RN and Patient moving all extremities  Post vital signs: Reviewed and unstable  Last Vitals:  Vitals Value Taken Time  BP 99/62 06/14/23 1038  Temp    Pulse 51 06/14/23 1037  Resp 22 06/14/23 1038  SpO2 88 % 06/14/23 1037  Vitals shown include unfiled device data.  Last Pain:  Vitals:   06/14/23 0852  TempSrc: Temporal  PainSc: 0-No pain         Complications: No notable events documented.

## 2023-06-14 NOTE — H&P (Signed)
Marissa Minium, MD [facility] [address], [address] [place], [place] [address] [phone] Fax : [phone]  Primary Care Physician:  Marissa Junes, FNP Primary Gastroenterologist:  Dr. Servando Gutierrez  Pre-Procedure History & Physical: HPI:  Marissa Gutierrez is a 57 y.o. female is here for an colonoscopy.   Past Medical History:  Diagnosis Date   GERD (gastroesophageal reflux disease)     Past Surgical History:  Procedure Laterality Date   CHOLECYSTECTOMY     COLONOSCOPY WITH PROPOFOL N/A 11/25/2020   Procedure: COLONOSCOPY WITH PROPOFOL;  Surgeon: Marissa Spillers, MD;  Location: [facility] ENDOSCOPY;  Service: Endoscopy;  Laterality: N/A;  PENDING C-19 TEST RESULT FROM 11/25/2020   DILATION AND CURETTAGE OF UTERUS  1989   TOOTH EXTRACTION     WISDOM TOOTH EXTRACTION      Prior to Admission medications   Medication Sig Start Date End Date Taking? Authorizing Provider  atorvastatin (LIPITOR) 80 MG tablet Take 80 mg by mouth daily. 06/12/21  Yes [provider]  [facility] ASPIRIN ADULT LOW DOSE 81 MG chewable tablet Chew 81 mg by mouth daily. 06/12/21  Yes [provider]  loperamide (IMODIUM) 1 MG/5ML solution Take 1 mg by mouth as needed for diarrhea or loose stools.   Yes [provider]  colestipol (COLESTID) 1 g tablet Take 2 tablets (2 g total) by mouth 2 (two) times daily. 02/07/22 05/31/23  Toney Reil, MD    Allergies as of 05/23/2023 - Review Complete 05/08/2023  Allergen Reaction Noted   Naproxen sodium Shortness Of Breath 09/15/2014    Family History  Problem Relation Age of Onset   Arthritis Mother    Lung cancer Father    Heart disease Maternal Grandfather     Social History   Socioeconomic History   Marital status: Married    Spouse name: Not on file   Number of children: Not on file   Years of education: Not on file   Highest education level: Not on file  Occupational History   Not on file  Tobacco Use   Smoking status: Every Day     Current packs/day: 1.00    Average packs/day: 1 pack/day for 40.0 years (40.0 ttl pk-yrs)    Types: Cigarettes   Smokeless tobacco: Never   Tobacco comments:    1PPD for 40 years. 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 Neck:  Supple; no masses or thyromegaly. Lungs:  Clear throughout to auscultation.  Heart:  Regular rate and rhythm. Abdomen:  Soft, nontender and nondistended. Normal bowel sounds, without guarding, and without rebound.   Neurologic:  Alert and  oriented x4;  grossly normal neurologically.  Impression/Plan: Marissa Gutierrez is here for an colonoscopy to be performed for a history of adenomatous polyps on 2022   Risks, benefits, limitations, and alternatives regarding  colonoscopy have been reviewed with the patient.  Questions have been answered.  All parties agreeable.   Marissa Minium, MD  06/14/2023, 9:04 AM

## 2023-06-14 NOTE — Op Note (Addendum)
[facility] Gastroenterology Patient Name: Marissa Gutierrez Procedure Date: 06/14/2023 9:04 AM MRN: [id number] Account #: [id number] Date of Birth: August 09, 1966 Admit Type: Outpatient Age: 57 Room: [facility] ENDO ROOM 3 Gender: Female Note Status: Supervisor Override Instrument Name: Nelda Marseille [id number] Procedure:             Colonoscopy Indications:           High risk colon cancer surveillance: Personal history                         of colonic polyps Providers:             Midge Minium MD, MD Referring MD:          Skipper Cliche. Crawford, FNP Medicines:             Propofol per Anesthesia Complications:         No immediate complications. Procedure:             Pre-Anesthesia Assessment:                        - Prior to the procedure, a History and Physical was                         performed, and patient medications and allergies were                         reviewed. The patient's tolerance of previous                         anesthesia was also reviewed. The risks and benefits                         of the procedure and the sedation options and risks                         were discussed with the patient. All questions were                         answered, and informed consent was obtained. Prior                         Anticoagulants: The patient has taken no anticoagulant                         or antiplatelet agents. ASA Grade Assessment: II - A                         patient with mild systemic disease. After reviewing                         the risks and benefits, the patient was deemed in                         satisfactory condition to undergo the procedure.                        After obtaining informed consent, the colonoscope was  passed under direct vision. Throughout the procedure,                         the patient's blood pressure, pulse, and oxygen                         saturations were monitored continuously. The                          Colonoscope was introduced through the anus and                         advanced to the the cecum, identified by appendiceal                         orifice and ileocecal valve. The colonoscopy was                         performed without difficulty. The patient tolerated                         the procedure well. The quality of the bowel                         preparation was good. Findings:      The perianal and digital rectal examinations were normal.      A 15 mm polyp was found in the ileocecal valve. The polyp was sessile.       The polyp was removed with a hot snare. Resection and retrieval were       complete.      An 18 mm polyp was found in the ascending colon. The polyp was sessile.       Preparations were made for mucosal resection. Chromoscopy with indigo       carmine was done. Demarcation of the lesion was performed during the       procedure to clearly identify the boundaries of the lesion. Saline with       indigo carmine was injected to raise the lesion. Snare mucosal resection       was performed. Resection was incomplete. The resected tissue was       retrieved. This was biopsied with a cold forceps for histology. Area was       tattooed with an injection of 3 mL of [place] ink.      A 12 mm polyp was found in the descending colon. The polyp was sessile.       Preparations were made for mucosal resection. Chromoscopy with indigo       carmine was done. Demarcation of the lesion was performed during the       procedure to clearly identify the boundaries of the lesion. Saline with       indigo carmine was injected to raise the lesion. Snare mucosal resection       was performed. Resection and retrieval were complete. Resected tissue       margins were examined and clear of polyp tissue. To close a defect after       polypectomy, two hemostatic clips were successfully placed (MR       conditional). Clip manufacturer: [employer]. There was no        bleeding at the  end of the procedure.      Two sessile polyps were found in the descending colon. The polyps were 3       to 4 mm in size. These polyps were removed with a cold snare. Resection       and retrieval were complete.      Non-bleeding internal hemorrhoids were found during retroflexion. The       hemorrhoids were Grade II (internal hemorrhoids that prolapse but reduce       spontaneously). Impression:            - One 15 mm polyp at the ileocecal valve, removed with                         a hot snare. Resected and retrieved.                        - One 18 mm polyp in the ascending colon, removed with                         mucosal resection. Incomplete resection. Resected                         tissue retrieved. Biopsied. Tattooed.                        - One 12 mm polyp in the descending colon, removed                         with mucosal resection. Resected and retrieved. Clips                         (MR conditional) were placed. Clip manufacturer:                         [employer].                        - Two 3 to 4 mm polyps in the descending colon,                         removed with a cold snare. Resected and retrieved.                        - Non-bleeding internal hemorrhoids.                        - Mucosal resection was performed. Resection was                         incomplete. The resected tissue was retrieved.                        - Mucosal resection was performed. Resection and                         retrieval were complete. Recommendation:        - Discharge patient to home.                        - Resume  previous diet.                        - Continue present medications.                        - Await pathology results.                        - If biopsies of ascening high grade dysplasia then                         surgical consult                        - Repeat colonoscopy in 6 months for surveillance. Procedure Code(s):     ---  Professional ---                        [phone], Colonoscopy, flexible; with endoscopic mucosal                         resection                        [id number], 59, Colonoscopy, flexible; with removal of                         tumor(s), polyp(s), or other lesion(s) by snare                         technique Diagnosis Code(s):     --- Professional ---                        Z86.010, Personal history of colonic polyps CPT copyright 2022 American Medical Association. All rights reserved. The codes documented in this report are preliminary and upon coder review may  be revised to meet current compliance requirements. Midge Minium MD, MD 06/14/2023 10:38:17 AM This report has been signed electronically. Number of Addenda: 0 Note Initiated On: 06/14/2023 9:04 AM Scope Withdrawal Time: 1 hour 14 minutes 49 seconds  Total Procedure Duration: 1 hour 19 minutes 3 seconds  Estimated Blood Loss:  Estimated blood loss: none.      [facility]

## 2023-06-14 NOTE — Anesthesia Procedure Notes (Signed)
Procedure Name: General with mask airway Date/Time: 06/14/2023 9:11 AM  Performed by: Lily Lovings, CRNAPre-anesthesia Checklist: Patient identified, Emergency Drugs available, Suction available, Patient being monitored and Timeout performed Patient Re-evaluated:Patient Re-evaluated prior to induction Oxygen Delivery Method: Simple face mask Preoxygenation: Pre-oxygenation with 100% oxygen Induction Type: IV induction

## 2023-06-14 NOTE — Anesthesia Postprocedure Evaluation (Signed)
Anesthesia Post Note  Patient: Marissa Gutierrez  Procedure(s) Performed: COLONOSCOPY WITH PROPOFOL POLYPECTOMY  Patient location during evaluation: PACU Anesthesia Type: General Level of consciousness: awake and alert, oriented and patient cooperative Pain management: pain level controlled Vital Signs Assessment: post-procedure vital signs reviewed and stable Respiratory status: spontaneous breathing, nonlabored ventilation and respiratory function stable Cardiovascular status: blood pressure returned to baseline and stable Postop Assessment: adequate PO intake Anesthetic complications: no   No notable events documented.   Last Vitals:  Vitals:   06/14/23 1047 06/14/23 1057  BP: 91/62 95/62  Pulse: 72 65  Resp: (!) 24 (!) 22  Temp:    SpO2: 98% 99%    Last Pain:  Vitals:   06/14/23 1057  TempSrc:   PainSc: 0-No pain                 Reed Breech

## 2023-06-15 ENCOUNTER — Encounter: Payer: Self-pay | Admitting: Gastroenterology

## 2023-06-15 ENCOUNTER — Telehealth: Payer: Self-pay

## 2023-06-15 LAB — SURGICAL PATHOLOGY

## 2023-06-15 NOTE — Telephone Encounter (Signed)
I just spoke to this pt, she reports RLQ abd cramping and soreness starting this morning, not tender to touch... denies fever or feeling feverish, has not noticed any bleeding... I did let her know that due to the size if her polyps it would not be unusual to have some discomfort for 1-2 days after.... ED precautions given.... Please advise   Per Dr Servando Snare recommendations are appropriate, pt is aware

## 2023-06-26 ENCOUNTER — Encounter: Payer: Self-pay | Admitting: Gastroenterology

## 2023-07-16 ENCOUNTER — Other Ambulatory Visit: Payer: Self-pay | Admitting: Gastroenterology

## 2023-07-25 ENCOUNTER — Encounter (INDEPENDENT_AMBULATORY_CARE_PROVIDER_SITE_OTHER): Payer: Self-pay | Admitting: Nurse Practitioner

## 2023-07-25 ENCOUNTER — Encounter (INDEPENDENT_AMBULATORY_CARE_PROVIDER_SITE_OTHER): Payer: Self-pay

## 2023-07-26 ENCOUNTER — Telehealth: Payer: Self-pay

## 2023-07-26 NOTE — Telephone Encounter (Signed)
Patient called in to get a refill on her medication. The patient stated that Dr. Servando Snare advised her to just call the office and we will refill the medication. The medication (Colestid) 1 g.

## 2023-07-26 NOTE — Telephone Encounter (Signed)
This medication was last filled by Dr Allegra Lai over 1 year ago... Pt has not been seen in over 2 years for an office visit, this must be scheduled and appt must be kept to continue refills.Marissa KitchenMarland Gutierrez

## 2023-07-31 NOTE — Telephone Encounter (Signed)
I called the patient back to schedule office visit and I didn't get answer I was unable to leave a voicemail. Waiting on the patient to call us back.

## 2023-08-17 ENCOUNTER — Other Ambulatory Visit (INDEPENDENT_AMBULATORY_CARE_PROVIDER_SITE_OTHER): Payer: Self-pay | Admitting: Nurse Practitioner

## 2023-08-17 DIAGNOSIS — I739 Peripheral vascular disease, unspecified: Secondary | ICD-10-CM

## 2023-08-24 ENCOUNTER — Ambulatory Visit (INDEPENDENT_AMBULATORY_CARE_PROVIDER_SITE_OTHER): Payer: Self-pay | Admitting: Nurse Practitioner

## 2023-08-24 ENCOUNTER — Encounter (INDEPENDENT_AMBULATORY_CARE_PROVIDER_SITE_OTHER): Payer: Self-pay | Admitting: Nurse Practitioner

## 2023-08-24 ENCOUNTER — Ambulatory Visit (INDEPENDENT_AMBULATORY_CARE_PROVIDER_SITE_OTHER): Payer: BC Managed Care – PPO

## 2023-08-24 VITALS — BP 143/87 | HR 60 | Resp 18 | Ht 64.0 in | Wt 298.4 lb

## 2023-08-24 DIAGNOSIS — Z720 Tobacco use: Secondary | ICD-10-CM

## 2023-08-24 DIAGNOSIS — I739 Peripheral vascular disease, unspecified: Secondary | ICD-10-CM | POA: Diagnosis not present

## 2023-08-25 NOTE — Progress Notes (Signed)
Subjective:    Patient ID: Marissa Gutierrez, female    DOB: [birth year], 57 y.o.   MRN: [id number] Chief Complaint  Patient presents with   New Patient (Initial Visit)    NP. abi/consult. pad. crawford    The patient is a 57 year old female who presents today for concern for peripheral arterial disease.  The patient denies any exact symptoms prompting this.  She notes that her primary care provider had sent her for a number of tests some she initially thought this visit was for evaluation of her Baker's cyst.  Unfortunately her primary care provider has left their office and so we are unable to contact them for clarification.  However the patient does deny having any claudication-like symptoms.  She denies any rest pain like symptoms or any open wounds or ulcerations.  Today her noninvasive studies reveal an ABI of 1.28 on the right and 1.10 on the left.  She has strong triphasic tibial artery waveforms with slightly dampened TBI's and toe waveforms    Review of Systems  Musculoskeletal:  Positive for arthralgias.  All other systems reviewed and are negative.      Objective:   Physical Exam Vitals reviewed.  HENT:     Head: Normocephalic.  Cardiovascular:     Rate and Rhythm: Normal rate.     Pulses:          Dorsalis pedis pulses are detected w/ Doppler on the right side and detected w/ Doppler on the left side.       Posterior tibial pulses are detected w/ Doppler on the right side and detected w/ Doppler on the left side.  Pulmonary:     Effort: Pulmonary effort is normal.  Skin:    General: Skin is warm and dry.  Neurological:     Mental Status: She is alert and oriented to person, place, and time.  Psychiatric:        Mood and Affect: Mood normal.        Behavior: Behavior normal.        Thought Content: Thought content normal.        Judgment: Judgment normal.     BP (!) 143/87 (BP Location: Right Wrist)   Pulse 60   Resp 18   Ht 5\' 4"  (1.626 m)   Wt 298 lb 6.4 oz  (135.4 kg)   BMI 51.22 kg/m   Past Medical History:  Diagnosis Date   GERD (gastroesophageal reflux disease)     Social History   Socioeconomic History   Marital status: Married    Spouse name: Not on file   Number of children: Not on file   Years of education: Not on file   Highest education level: Not on file  Occupational History   Not on file  Tobacco Use   Smoking status: Every Day    Current packs/day: 1.00    Average packs/day: 1 pack/day for 40.0 years (40.0 ttl pk-yrs)    Types: Cigarettes   Smokeless tobacco: Never   Tobacco comments:    1PPD for 40 years. 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   Social Determinants of Health  Financial Resource Strain: Not on file  Food Insecurity: Not on file  Transportation Needs: Not on file  Physical Activity: Not on file  Stress: Not on file  Social Connections: Not on file  Intimate Partner Violence: Not on file    Past Surgical History:  Procedure Laterality Date   CHOLECYSTECTOMY     COLONOSCOPY WITH PROPOFOL N/A 11/25/2020   Procedure: COLONOSCOPY WITH PROPOFOL;  Surgeon: Pasty Spillers, MD;  Location: [facility] ENDOSCOPY;  Service: Endoscopy;  Laterality: N/A;  PENDING C-19 TEST RESULT FROM 11/25/2020   COLONOSCOPY WITH PROPOFOL N/A 06/14/2023   Procedure: COLONOSCOPY WITH PROPOFOL;  Surgeon: Midge Minium, MD;  Location: [facility] ENDOSCOPY;  Service: Endoscopy;  Laterality: N/A;   DILATION AND CURETTAGE OF UTERUS  1989   POLYPECTOMY  06/14/2023   Procedure: POLYPECTOMY;  Surgeon: Midge Minium, MD;  Location: [facility] ENDOSCOPY;  Service: Endoscopy;;   TOOTH EXTRACTION     WISDOM TOOTH EXTRACTION      Family History  Problem Relation Age of Onset    Arthritis Mother    Lung cancer Father    Heart disease Maternal Grandfather     Allergies  Allergen Reactions   Naproxen Sodium Shortness Of Breath       Latest Ref Rng & Units 01/26/2015    5:17 AM  CBC  WBC 3.6 - 11.0 x10 3/mm 3 12.1   Hemoglobin 12.0 - 16.0 g/dL 40.9   Hematocrit 81.1 - 47.0 % 45.1   Platelets 150 - 440 x10 3/mm 3 192       CMP     Component Value Date/Time   NA 135 01/26/2015 0517   K 3.8 01/26/2015 0517   CL 102 01/26/2015 0517   CO2 28 01/26/2015 0517   GLUCOSE 132 (H) 01/26/2015 0517   BUN 11 01/26/2015 0517   CREATININE 0.81 01/26/2015 0517   CALCIUM 8.7 (L) 01/26/2015 0517   PROT 6.7 11/17/2020 1515   PROT 7.5 01/26/2015 0517   ALBUMIN 3.7 (L) 11/17/2020 1515   ALBUMIN 3.8 01/26/2015 0517   AST 28 11/17/2020 1515   AST 19 01/26/2015 0517   ALT 27 11/17/2020 1515   ALT 16 01/26/2015 0517   ALKPHOS 86 11/17/2020 1515   ALKPHOS 57 01/26/2015 0517   BILITOT 0.4 11/17/2020 1515   BILITOT 0.6 01/26/2015 0517   GFRNONAA >60 01/26/2015 0517     No results found.     Assessment & Plan:   1. PAD (peripheral artery disease) (HCC) Today the patient's studies were largely normal with some abnormal TBI's.  Given that she also did not have any symptoms I suspect this may be be due to more vasospasm.  There is less risk factors such as claudication or rest pain of the development of wounds and ulcers that will not heal.  She still seems to be having be more than happy to have the patient return for follow-up evaluation.  Otherwise we will see the patient as needed.  2. Tobacco abuse Patient's tobacco use puts her at worse risk of arterial disease, smoking cessation advised   Current Outpatient Medications on File Prior to Visit  Medication Sig Dispense Refill   celecoxib (CELEBREX) 100 MG capsule Take 100 mg by mouth 2 (two) times daily as needed.     colestipol (COLESTID) 1 g tablet Take 2 tablets (2 g total) by mouth 2 (two) times daily. 360  tablet 0   [facility] ASPIRIN ADULT LOW DOSE 81 MG chewable tablet Chew 81 mg by  mouth daily.     atorvastatin (LIPITOR) 80 MG tablet Take 80 mg by mouth daily. (Patient not taking: Reported on 08/24/2023)     loperamide (IMODIUM) 1 MG/5ML solution Take 1 mg by mouth as needed for diarrhea or loose stools. (Patient not taking: Reported on 08/24/2023)     No current facility-administered medications on file prior to visit.    There are no Patient Instructions on file for this visit. No follow-ups on file.   Georgiana Spinner, NP

## 2023-09-06 NOTE — Progress Notes (Signed)
[name], PA-C [address]  [address]  [place], [place] [address]  Main: [phone]  Fax: [phone]   Primary Care Physician: Lorn Junes, FNP (Inactive)  Primary Gastroenterologist:  [name], PA-C / Dr. Midge Minium    CC: F/U Chronic Bile Salt Diarrhea  HPI: Marissa Gutierrez is a 57 y.o. female returns for follow-up of chronic bile salt diarrhea postcholecystectomy.  Last office visit here 06/2021 with Dr. Maximino Greenland.  Was started on Colestid 2 tablets twice daily with great benefit.  No longer taking Imodium.  Needs refill of Colestid.  He has occasional hemorrhoid symptoms and we discussed treatment.  No recent episodes of rectal bleeding.  11/2020 colonoscopy by Dr. Maximino Greenland: 6 mm polyp removed from cecum, 15 mm polyp removed from transverse colon, 4 mm polyp removed from sigmoid colon.  All polyps were sessile serrated polyps.  No dysplasia.  Fair prep.  Hemorrhoids and diverticulosis.  06/2023 colonoscopy by Dr. Servando Snare: (With extra 2-day prep): Good prep.  15 mm polyp removed from ileocecal valve.  18 mm polyp removed from ascending colon.  12 mm polyp removed from descending colon.  2 small (3 mm to 4 mm) polyps removed from descending colon.  All polyps were sessile serrated with no dysplasia.  Grade 2 internal hemorrhoids.  Repeat colonoscopy in 6 months was recommended (due March 2025).  Current Outpatient Medications  Medication Sig Dispense Refill   celecoxib (CELEBREX) 100 MG capsule Take 100 mg by mouth 2 (two) times daily as needed.     [facility] ASPIRIN ADULT LOW DOSE 81 MG chewable tablet Chew 81 mg by mouth daily.     hydrocortisone (ANUSOL-HC) 2.5 % rectal cream Place 1 Application rectally 2 (two) times daily. 30 g 1   colestipol (COLESTID) 1 g tablet Take 2 tablets (2 g total) by mouth 2 (two) times daily. 360 tablet 3   No current facility-administered medications for this visit.    Allergies as of 09/07/2023 - Review Complete 09/07/2023  Allergen  Reaction Noted   Naproxen sodium Shortness Of Breath 09/15/2014    Past Medical History:  Diagnosis Date   GERD (gastroesophageal reflux disease)     Past Surgical History:  Procedure Laterality Date   CHOLECYSTECTOMY     COLONOSCOPY WITH PROPOFOL N/A 11/25/2020   Procedure: COLONOSCOPY WITH PROPOFOL;  Surgeon: Pasty Spillers, MD;  Location: [facility] ENDOSCOPY;  Service: Endoscopy;  Laterality: N/A;  PENDING C-19 TEST RESULT FROM 11/25/2020   COLONOSCOPY WITH PROPOFOL N/A 06/14/2023   Procedure: COLONOSCOPY WITH PROPOFOL;  Surgeon: Midge Minium, MD;  Location: [facility] ENDOSCOPY;  Service: Endoscopy;  Laterality: N/A;   DILATION AND CURETTAGE OF UTERUS  1989   POLYPECTOMY  06/14/2023   Procedure: POLYPECTOMY;  Surgeon: Midge Minium, MD;  Location: [facility] ENDOSCOPY;  Service: Endoscopy;;   TOOTH EXTRACTION     WISDOM TOOTH EXTRACTION      Review of Systems:    All systems reviewed and negative except where noted in HPI.   Physical Examination:   BP 134/83 (BP Location: Right Arm, Patient Position: Sitting, Cuff Size: Large)   Pulse 64   Temp 97.7 F (36.5 C) (Oral)   Ht 5\' 4"  (1.626 m)   Wt (!) 303 lb 6 oz (137.6 kg)   BMI 52.07 kg/m   General: Well-nourished, well-developed in no acute distress.  Lungs: Clear to auscultation bilaterally. Non-labored. Heart: Regular rate and rhythm, no murmurs rubs or gallops.  Abdomen: Bowel sounds are normal; Abdomen  is Soft; No hepatosplenomegaly, masses or hernias;  No Abdominal Tenderness; No guarding or rebound tenderness. Neuro: Alert and oriented x 3.  Grossly intact.  Psych: Alert and cooperative, normal mood and affect.   Imaging Studies: VAS [place] ABI WITH/WO TBI  Result Date: 08/28/2023  LOWER EXTREMITY DOPPLER STUDY Patient Name:  Marissa Gutierrez  Date of Exam:   08/24/2023 Medical Rec #: [id number]     Accession #:    [id number] Date of Birth: 01-27-1966     Patient Gender: F Patient Age:   9 years Exam Location:  [facility] Procedure:      VAS [place] ABI WITH/WO TBI Referring Phys: Sheppard Plumber --------------------------------------------------------------------------------  Indications: Rest pain.  Performing Technologist: Salvadore Farber RVT  Examination Guidelines: A complete evaluation includes at minimum, Doppler waveform signals and systolic blood pressure reading at the level of bilateral brachial, anterior tibial, and posterior tibial arteries, when vessel segments are accessible. Bilateral testing is considered an integral part of a complete examination. Photoelectric Plethysmograph (PPG) waveforms and toe systolic pressure readings are included as required and additional duplex testing as needed. Limited examinations for reoccurring indications may be performed as noted.  ABI Findings: +---------+------------------+-----+---------+--------+ Right    Rt Pressure (mmHg)IndexWaveform Comment  +---------+------------------+-----+---------+--------+ Brachial 137                                      +---------+------------------+-----+---------+--------+ ATA      155               1.11 triphasic         +---------+------------------+-----+---------+--------+ PTA      179               1.28 triphasic         +---------+------------------+-----+---------+--------+ Great Toe50                0.36 Abnormal          +---------+------------------+-----+---------+--------+ +---------+------------------+-----+---------+-------+ Left     Lt Pressure (mmHg)IndexWaveform Comment +---------+------------------+-----+---------+-------+ Brachial 140                                     +---------+------------------+-----+---------+-------+ ATA      154               1.10 triphasic        +---------+------------------+-----+---------+-------+ PTA      152               1.09 triphasic        +---------+------------------+-----+---------+-------+ Great Toe63                0.45 Abnormal          +---------+------------------+-----+---------+-------+  Summary: Right: Resting right ankle-brachial index is within normal range. The right toe-brachial index is abnormal. PPG tracings appear dampened. Left: Resting left ankle-brachial index is within normal range. The left toe-brachial index is abnormal. PPG tracings appear dampened. *See table(s) above for measurements and observations.  Electronically signed by Festus Barren MD on 08/28/2023 at 7:23:40 AM.    Final     Assessment and Plan:   Marissa Gutierrez is a 57 y.o. y/o female returns for f/u:  1.  Chronic bile salt diarrhea postcholecystectomy.  Continue Rx Colestid 1 g, 2 tablets twice a daily, 11 refills.  2.  History of multiple large sessile serrated colon polyps.  58-month repeat colonoscopy will be due March 2025.  She will need 2-day extra prep.  3.  Hemorrhoids  Rx Hydrocortisone Cream 2.5%, Apply 2-3 x daily prn, 30g, 1 RF.  [name], PA-C  Follow up March 2025 for Repeat Colonoscopy; F/U OV 1 year for med Refill.

## 2023-09-07 ENCOUNTER — Ambulatory Visit: Payer: BC Managed Care – PPO | Admitting: Physician Assistant

## 2023-09-07 ENCOUNTER — Encounter: Payer: Self-pay | Admitting: Physician Assistant

## 2023-09-07 VITALS — BP 134/83 | HR 64 | Temp 97.7°F | Ht 64.0 in | Wt 303.4 lb

## 2023-09-07 DIAGNOSIS — Z860100 Personal history of colon polyps, unspecified: Secondary | ICD-10-CM

## 2023-09-07 DIAGNOSIS — K9089 Other intestinal malabsorption: Secondary | ICD-10-CM | POA: Diagnosis not present

## 2023-09-07 DIAGNOSIS — K649 Unspecified hemorrhoids: Secondary | ICD-10-CM

## 2023-09-07 DIAGNOSIS — Z860101 Personal history of adenomatous and serrated colon polyps: Secondary | ICD-10-CM

## 2023-09-07 MED ORDER — COLESTIPOL HCL 1 G PO TABS: 2.0000 g | ORAL_TABLET | Freq: Two times a day (BID) | ORAL | 3 refills | Status: AC

## 2023-09-07 MED ORDER — HYDROCORTISONE (PERIANAL) 2.5 % EX CREA: 1.0000 | TOPICAL_CREAM | Freq: Two times a day (BID) | CUTANEOUS | 1 refills | Status: AC

## 2023-12-18 ENCOUNTER — Encounter: Payer: Self-pay | Admitting: Physician Assistant

## 2023-12-19 ENCOUNTER — Other Ambulatory Visit: Payer: Self-pay

## 2023-12-19 DIAGNOSIS — Z860100 Personal history of colon polyps, unspecified: Secondary | ICD-10-CM

## 2023-12-19 MED ORDER — NA SULFATE-K SULFATE-MG SULF 17.5-3.13-1.6 GM/177ML PO SOLN: 354.0000 mL | Freq: Once | ORAL | 0 refills | Status: AC

## 2023-12-19 NOTE — Telephone Encounter (Signed)
 Called and got patient scheduled for 01/03/2024. Went over instructions, mailed them and sent to [employer]. Sent prep to the pharmacy

## 2024-01-02 ENCOUNTER — Encounter: Payer: Self-pay | Admitting: Gastroenterology

## 2024-01-03 ENCOUNTER — Ambulatory Visit: Admitting: Anesthesiology

## 2024-01-03 ENCOUNTER — Encounter: Payer: Self-pay | Admitting: Gastroenterology

## 2024-01-03 ENCOUNTER — Ambulatory Visit
Admission: RE | Admit: 2024-01-03 | Discharge: 2024-01-03 | Disposition: A | Discharge status: Home / Self Care | Attending: Gastroenterology | Admitting: Gastroenterology

## 2024-01-03 ENCOUNTER — Other Ambulatory Visit: Payer: Self-pay

## 2024-01-03 ENCOUNTER — Encounter
Admission: RE | Disposition: A | Discharge status: Home / Self Care | Payer: Self-pay | Source: Home / Self Care | Attending: Gastroenterology

## 2024-01-03 DIAGNOSIS — K219 Gastro-esophageal reflux disease without esophagitis: Secondary | ICD-10-CM | POA: Diagnosis not present

## 2024-01-03 DIAGNOSIS — D122 Benign neoplasm of ascending colon: Secondary | ICD-10-CM | POA: Diagnosis not present

## 2024-01-03 DIAGNOSIS — D124 Benign neoplasm of descending colon: Secondary | ICD-10-CM | POA: Insufficient documentation

## 2024-01-03 DIAGNOSIS — K5730 Diverticulosis of large intestine without perforation or abscess without bleeding: Secondary | ICD-10-CM | POA: Insufficient documentation

## 2024-01-03 DIAGNOSIS — K635 Polyp of colon: Secondary | ICD-10-CM

## 2024-01-03 DIAGNOSIS — Z79899 Other long term (current) drug therapy: Secondary | ICD-10-CM | POA: Diagnosis not present

## 2024-01-03 DIAGNOSIS — K640 First degree hemorrhoids: Secondary | ICD-10-CM | POA: Insufficient documentation

## 2024-01-03 DIAGNOSIS — Z860100 Personal history of colon polyps, unspecified: Secondary | ICD-10-CM

## 2024-01-03 DIAGNOSIS — Z7982 Long term (current) use of aspirin: Secondary | ICD-10-CM | POA: Diagnosis not present

## 2024-01-03 DIAGNOSIS — F17210 Nicotine dependence, cigarettes, uncomplicated: Secondary | ICD-10-CM | POA: Insufficient documentation

## 2024-01-03 DIAGNOSIS — Z1211 Encounter for screening for malignant neoplasm of colon: Secondary | ICD-10-CM | POA: Insufficient documentation

## 2024-01-03 HISTORY — PX: SUBMUCOSAL INJECTION: SHX5543

## 2024-01-03 HISTORY — PX: POLYPECTOMY: SHX5525

## 2024-01-03 HISTORY — PX: COLONOSCOPY: SHX5424

## 2024-01-03 SURGERY — COLONOSCOPY
Anesthesia: General

## 2024-01-03 MED ORDER — LIDOCAINE HCL (PF) 2 % IJ SOLN
INTRAMUSCULAR | Status: AC
  Filled 2024-01-03: qty 5

## 2024-01-03 MED ORDER — PROPOFOL 500 MG/50ML IV EMUL
INTRAVENOUS | Status: DC | PRN
  Administered 2024-01-03: 75 ug/kg/min via INTRAVENOUS

## 2024-01-03 MED ORDER — PROPOFOL 10 MG/ML IV BOLUS
INTRAVENOUS | Status: DC | PRN
  Administered 2024-01-03 (×2): 20 mg via INTRAVENOUS
  Administered 2024-01-03: 60 mg via INTRAVENOUS

## 2024-01-03 MED ORDER — SODIUM CHLORIDE 0.9 % IV SOLN
INTRAVENOUS | Status: DC
Start: 2024-01-03 — End: 2024-01-03

## 2024-01-03 MED ORDER — DEXMEDETOMIDINE HCL IN NACL 80 MCG/20ML IV SOLN
INTRAVENOUS | Status: DC | PRN
Start: 2024-01-03 — End: 2024-01-03
  Administered 2024-01-03: 20 ug via INTRAVENOUS

## 2024-01-03 MED ORDER — PHENYLEPHRINE 80 MCG/ML (10ML) SYRINGE FOR IV PUSH (FOR BLOOD PRESSURE SUPPORT)
PREFILLED_SYRINGE | INTRAVENOUS | Status: DC | PRN
  Administered 2024-01-03 (×4): 160 ug via INTRAVENOUS

## 2024-01-03 MED ORDER — LIDOCAINE HCL (CARDIAC) PF 100 MG/5ML IV SOSY
PREFILLED_SYRINGE | INTRAVENOUS | Status: DC | PRN
  Administered 2024-01-03: 100 mg via INTRAVENOUS

## 2024-01-03 NOTE — H&P (Signed)
 Midge Minium, MD [facility] [address], [address] [place], [place] [address] Phone:[phone] Fax : [phone]  Primary Care Physician:  Lorn Junes, FNP (Inactive) Primary Gastroenterologist:  Dr. Servando Snare  Pre-Procedure History & Physical: HPI:  Marissa Gutierrez is a 58 y.o. female is here for an colonoscopy.   Past Medical History:  Diagnosis Date   GERD (gastroesophageal reflux disease)     Past Surgical History:  Procedure Laterality Date   CHOLECYSTECTOMY     COLONOSCOPY WITH PROPOFOL N/A 11/25/2020   Procedure: COLONOSCOPY WITH PROPOFOL;  Surgeon: Pasty Spillers, MD;  Location: [facility] ENDOSCOPY;  Service: Endoscopy;  Laterality: N/A;  PENDING C-19 TEST RESULT FROM 11/25/2020   COLONOSCOPY WITH PROPOFOL N/A 06/14/2023   Procedure: COLONOSCOPY WITH PROPOFOL;  Surgeon: Midge Minium, MD;  Location: [facility] ENDOSCOPY;  Service: Endoscopy;  Laterality: N/A;   DILATION AND CURETTAGE OF UTERUS  1989   POLYPECTOMY  06/14/2023   Procedure: POLYPECTOMY;  Surgeon: Midge Minium, MD;  Location: [facility] ENDOSCOPY;  Service: Endoscopy;;   TOOTH EXTRACTION     Marissa TOOTH EXTRACTION      Prior to Admission medications   Medication Sig Start Date End Date Taking? Authorizing Provider  celecoxib (CELEBREX) 100 MG capsule Take 100 mg by mouth 2 (two) times daily as needed. 07/29/23  Yes [provider]  colestipol (COLESTID) 1 g tablet Take 2 tablets (2 g total) by mouth 2 (two) times daily. 09/07/23 09/01/24 Yes [name], PA-C  [facility] ASPIRIN ADULT LOW DOSE 81 MG chewable tablet Chew 81 mg by mouth daily. 06/12/21  Yes [provider]  hydrocortisone (ANUSOL-HC) 2.5 % rectal cream Place 1 Application rectally 2 (two) times daily. 09/07/23   [name], PA-C    Allergies as of 12/19/2023 - Review Complete 09/07/2023  Allergen Reaction Noted   Naproxen sodium Shortness Of Breath 09/15/2014    Family History  Problem Relation Age of Onset   Arthritis Mother    Lung cancer  Father    Heart disease Maternal Grandfather     Social History   Socioeconomic History   Marital status: Married    Spouse name: Not on file   Number of children: Not on file   Years of education: Not on file   Highest education level: Not on file  Occupational History   Not on file  Tobacco Use   Smoking status: Every Day    Current packs/day: 1.00    Average packs/day: 1 pack/day for 40.0 years (40.0 ttl pk-yrs)    Types: Cigarettes   Smokeless tobacco: Never   Tobacco comments:    1PPD for 40 years. Khj 05/31/2023  Vaping Use   Vaping status: Never Used  Substance and Sexual Activity   Alcohol use: Not Currently   Drug use: No   Sexual activity: Yes    Birth control/protection: Condom  Other Topics Concern   Not on file  Social History Narrative   Works for Sports administrator   One son- 67 yo   Social Drivers of Corporate investment banker Strain: Not on Ship broker Insecurity: Not on file  Transportation Needs: Not on file  Physical Activity: Not on file  Stress: Not on file  Social Connections: Not on file  Intimate Partner Violence: Not on file    Review of Systems: See HPI, otherwise negative ROS  Physical Exam: BP 134/83   Pulse 65   Temp (!) 96.2 F (35.7 C) (Temporal)   Resp 14  Ht 5\' 7"  (1.702 m)   Wt 134.7 kg   SpO2 93%   BMI 46.52 kg/m  General:   Alert,  pleasant and cooperative in NAD Head:  Normocephalic and atraumatic. Neck:  Supple; no masses or thyromegaly. Lungs:  Clear throughout to auscultation.    Heart:  Regular rate and rhythm. Abdomen:  Soft, nontender and nondistended. Normal bowel sounds, without guarding, and without rebound.   Neurologic:  Alert and  oriented x4;  grossly normal neurologically.  Impression/Plan: Marissa Gutierrez is here for an colonoscopy to be performed for a history of adenomatous polyps on 2024   Risks, benefits, limitations, and alternatives regarding  colonoscopy have been reviewed with the  patient.  Questions have been answered.  All parties agreeable.   Midge Minium, MD  01/03/2024, 8:02 AM

## 2024-01-03 NOTE — Anesthesia Postprocedure Evaluation (Signed)
 Anesthesia Post Note  Patient: Marissa Gutierrez  Procedure(s) Performed: COLONOSCOPY POLYPECTOMY  Patient location during evaluation: Endoscopy Anesthesia Type: General Level of consciousness: awake and alert Pain management: pain level controlled Vital Signs Assessment: post-procedure vital signs reviewed and stable Respiratory status: spontaneous breathing, nonlabored ventilation, respiratory function stable and patient connected to nasal cannula oxygen Cardiovascular status: blood pressure returned to baseline and stable Postop Assessment: no apparent nausea or vomiting Anesthetic complications: no   No notable events documented.   Last Vitals:  Vitals:   01/03/24 0925 01/03/24 0935  BP: (!) 106/56 111/63  Pulse: 63   Resp: 16   Temp: (!) 36.1 C   SpO2: 97% 97%    Last Pain:  Vitals:   01/03/24 0935  TempSrc:   PainSc: 0-No pain                 Louie Boston

## 2024-01-03 NOTE — Transfer of Care (Signed)
 Immediate Anesthesia Transfer of Care Note  Patient: Marissa Gutierrez  Procedure(s) Performed: COLONOSCOPY POLYPECTOMY  Patient Location: PACU  Anesthesia Type:General  Level of Consciousness: sedated  Airway & Oxygen Therapy: Patient Spontanous Breathing  Post-op Assessment: Report given to RN and Post -op Vital signs reviewed and stable  Post vital signs: Reviewed and stable  Last Vitals:  Vitals Value Taken Time  BP 86/52 01/03/24 0927  Temp    Pulse 63 01/03/24 0927  Resp 16 01/03/24 0927  SpO2 96 % 01/03/24 0927  Vitals shown include unfiled device data.  Last Pain:  Vitals:   01/03/24 0742  TempSrc: Temporal  PainSc: 0-No pain         Complications: No notable events documented.

## 2024-01-03 NOTE — Anesthesia Preprocedure Evaluation (Signed)
 Anesthesia Evaluation  Patient identified by MRN, date of birth, ID band Patient awake    Reviewed: Allergy & Precautions, NPO status , Patient's Chart, lab work & pertinent test results  History of Anesthesia Complications Negative for: history of anesthetic complications  Airway Mallampati: II  TM Distance: >3 FB Neck ROM: full    Dental no notable dental hx.    Pulmonary neg pulmonary ROS, Current Smoker and Patient abstained from smoking.   Pulmonary exam normal        Cardiovascular negative cardio ROS Normal cardiovascular exam     Neuro/Psych TIA negative psych ROS   GI/Hepatic Neg liver ROS,GERD  Medicated,,  Endo/Other  negative endocrine ROS    Renal/GU negative Renal ROS  negative genitourinary   Musculoskeletal   Abdominal   Peds  Hematology negative hematology ROS (+)   Anesthesia Other Findings Past Medical History: No date: GERD (gastroesophageal reflux disease)  Past Surgical History: No date: CHOLECYSTECTOMY 11/25/2020: COLONOSCOPY WITH PROPOFOL; N/A     Comment:  Procedure: COLONOSCOPY WITH PROPOFOL;  Surgeon:               Pasty Spillers, MD;  Location: [facility] ENDOSCOPY;                Service: Endoscopy;  Laterality: N/A;  PENDING C-19 TEST               RESULT FROM 11/25/2020 06/14/2023: COLONOSCOPY WITH PROPOFOL; N/A     Comment:  Procedure: COLONOSCOPY WITH PROPOFOL;  Surgeon: Midge Minium, MD;  Location: [facility] ENDOSCOPY;  Service:               Endoscopy;  Laterality: N/A; 1989: DILATION AND CURETTAGE OF UTERUS 06/14/2023: POLYPECTOMY     Comment:  Procedure: POLYPECTOMY;  Surgeon: Midge Minium, MD;                Location: [facility] ENDOSCOPY;  Service: Endoscopy;; No date: TOOTH EXTRACTION No date: WISDOM TOOTH EXTRACTION  BMI    Body Mass Index: 46.52 kg/m      Reproductive/Obstetrics negative OB ROS                             Anesthesia  Physical Anesthesia Plan  ASA: 2  Anesthesia Plan: General   Post-op Pain Management: Minimal or no pain anticipated   Induction: Intravenous  PONV Risk Score and Plan: 2 and Propofol infusion and TIVA  Airway Management Planned: Natural Airway and Nasal Cannula  Additional Equipment:   Intra-op Plan:   Post-operative Plan:   Informed Consent: I have reviewed the patients History and Physical, chart, labs and discussed the procedure including the risks, benefits and alternatives for the proposed anesthesia with the patient or authorized representative who has indicated his/her understanding and acceptance.     Dental Advisory Given  Plan Discussed with: Anesthesiologist, CRNA and Surgeon  Anesthesia Plan Comments: (Patient consented for risks of anesthesia including but not limited to:  - adverse reactions to medications - risk of airway placement if required - damage to eyes, teeth, lips or other oral mucosa - nerve damage due to positioning  - sore throat or hoarseness - Damage to heart, brain, nerves, lungs, other parts of body or loss of life  Patient voiced understanding and assent.)       Anesthesia Quick Evaluation

## 2024-01-03 NOTE — Op Note (Addendum)
 [facility] Gastroenterology Patient Name: Marissa Gutierrez Procedure Date: 01/03/2024 8:30 AM MRN: [id number] Account #: [id number] Date of Birth: 07/06/1966 Admit Type: Outpatient Age: 58 Room: [facility] ENDO ROOM 4 Gender: Female Note Status: Finalized Instrument Name: Prentice Docker [id number] Procedure:             Colonoscopy Indications:           High risk colon cancer surveillance: Personal history                         of colonic polyps Providers:             Midge Minium MD, MD Referring MD:          Lorenda Cahill FNP Medicines:             Propofol per Anesthesia Complications:         No immediate complications. Procedure:             Pre-Anesthesia Assessment:                        - Prior to the procedure, a History and Physical was                         performed, and patient medications and allergies were                         reviewed. The patient's tolerance of previous                         anesthesia was also reviewed. The risks and benefits                         of the procedure and the sedation options and risks                         were discussed with the patient. All questions were                         answered, and informed consent was obtained. Prior                         Anticoagulants: The patient has taken no anticoagulant                         or antiplatelet agents. ASA Grade Assessment: II - A                         patient with mild systemic disease. After reviewing                         the risks and benefits, the patient was deemed in                         satisfactory condition to undergo the procedure.                        After obtaining informed consent, the colonoscope was  passed under direct vision. Throughout the procedure,                         the patient's blood pressure, pulse, and oxygen                         saturations were monitored continuously. The                          Colonoscope was introduced through the anus and                         advanced to the the cecum, identified by appendiceal                         orifice and ileocecal valve. The colonoscopy was                         performed without difficulty. The patient tolerated                         the procedure well. The quality of the bowel                         preparation was adequate to identify polyps. Findings:      The perianal and digital rectal examinations were normal.      A 10 mm polyp was found in the ascending colon. The polyp was sessile.       The polyp was removed with a cold snare. Resection and retrieval were       complete. Coagulation for tissue destruction using argon plasma at 2       liters/minute and 20 watts was successful. Preparations were made for       mucosal resection. Chromoscopy with indigo carmine was done. Demarcation       of the lesion was performed during the procedure to clearly identify the       boundaries of the lesion. Saline with indigo carmine was injected to       raise the lesion. Snare mucosal resection was performed. Resection and       retrieval were complete. Resected tissue margins were examined and clear       of polyp tissue.      Three sessile polyps were found in the descending colon. The polyps were       1 to 2 mm in size. These polyps were removed with a cold snare.       Resection and retrieval were complete.      A few small-mouthed diverticula were found in the entire colon.      Non-bleeding internal hemorrhoids were found during retroflexion. The       hemorrhoids were Grade I (internal hemorrhoids that do not prolapse). Impression:            - One 10 mm polyp in the ascending colon, removed with                         a cold snare and removed with mucosal resection.                         Resected  and retrieved. Treated with argon plasma                         coagulation (APC).                        - Three 1 to 2 mm  polyps in the descending colon,                         removed with a cold snare. Resected and retrieved.                        - Diverticulosis in the entire examined colon.                        - Non-bleeding internal hemorrhoids.                        - Mucosal resection was performed. Resection and                         retrieval were complete. Recommendation:        - Discharge patient to home.                        - Resume previous diet.                        - Continue present medications.                        - Await pathology results.                        - Repeat colonoscopy in 1 year for surveillance. Procedure Code(s):     --- Professional ---                        [phone], Colonoscopy, flexible; with endoscopic mucosal                         resection                        [id number], 59, Colonoscopy, flexible; with removal of                         tumor(s), polyp(s), or other lesion(s) by snare                         technique Diagnosis Code(s):     --- Professional ---                        Z86.010, Personal history of colonic polyps                        D12.4, Benign neoplasm of descending colon CPT copyright 2022 American Medical Association. All rights reserved. The codes documented in this report are preliminary and upon coder review may  be revised to meet current compliance requirements. Midge Minium MD, MD 01/03/2024 9:27:54 AM This report has been signed electronically. Number of Addenda:  0 Note Initiated On: 01/03/2024 8:30 AM Scope Withdrawal Time: 0 hours 9 minutes 25 seconds  Total Procedure Duration: 0 hours 33 minutes 40 seconds  Estimated Blood Loss:  Estimated blood loss: none.      [facility]

## 2024-01-04 ENCOUNTER — Encounter: Payer: Self-pay | Admitting: Gastroenterology

## 2024-01-04 LAB — SURGICAL PATHOLOGY

## 2024-01-08 ENCOUNTER — Encounter: Payer: Self-pay | Admitting: Gastroenterology

## 2024-02-19 ENCOUNTER — Encounter (INDEPENDENT_AMBULATORY_CARE_PROVIDER_SITE_OTHER): Payer: Self-pay

## 2024-04-28 ENCOUNTER — Encounter: Payer: Self-pay | Admitting: Acute Care

## 2024-09-26 ENCOUNTER — Other Ambulatory Visit: Payer: Self-pay

## 2024-09-26 DIAGNOSIS — K9089 Other intestinal malabsorption: Secondary | ICD-10-CM

## 2024-10-06 ENCOUNTER — Other Ambulatory Visit: Payer: Self-pay

## 2024-10-06 DIAGNOSIS — K9089 Other intestinal malabsorption: Secondary | ICD-10-CM
